# Patient Record
Sex: Female | Born: 1955 | Race: White | Hispanic: No | Smoking: Current every day smoker
Health system: Southern US, Community
[De-identification: ages and names within clinical notes are randomized; demographics above are authoritative.]

## PROBLEM LIST (undated history)

## (undated) DIAGNOSIS — G4730 Sleep apnea, unspecified: Secondary | ICD-10-CM

## (undated) DIAGNOSIS — J984 Other disorders of lung: Secondary | ICD-10-CM

## (undated) DIAGNOSIS — F32A Depression, unspecified: Secondary | ICD-10-CM

## (undated) DIAGNOSIS — E785 Hyperlipidemia, unspecified: Secondary | ICD-10-CM

## (undated) DIAGNOSIS — F329 Major depressive disorder, single episode, unspecified: Secondary | ICD-10-CM

## (undated) DIAGNOSIS — I10 Essential (primary) hypertension: Secondary | ICD-10-CM

## (undated) DIAGNOSIS — J449 Chronic obstructive pulmonary disease, unspecified: Secondary | ICD-10-CM

## (undated) DIAGNOSIS — F419 Anxiety disorder, unspecified: Secondary | ICD-10-CM

## (undated) HISTORY — DX: Chronic obstructive pulmonary disease, unspecified: J44.9

## (undated) HISTORY — PX: NASAL SINUS SURGERY: SHX719

## (undated) HISTORY — DX: Hyperlipidemia, unspecified: E78.5

## (undated) HISTORY — PX: CHOLECYSTECTOMY: SHX55

## (undated) HISTORY — PX: ABDOMINAL HYSTERECTOMY: SHX81

## (undated) HISTORY — PX: TONSILLECTOMY: SUR1361

---

## 1999-04-02 ENCOUNTER — Emergency Department (HOSPITAL_COMMUNITY): Admission: EM | Admit: 1999-04-02 | Discharge: 1999-04-02 | Payer: Self-pay | Admitting: Emergency Medicine

## 2000-06-04 ENCOUNTER — Encounter: Admission: RE | Admit: 2000-06-04 | Discharge: 2000-08-05 | Payer: Self-pay | Admitting: Family Medicine

## 2003-09-08 ENCOUNTER — Emergency Department (HOSPITAL_COMMUNITY): Admission: EM | Admit: 2003-09-08 | Discharge: 2003-09-09 | Payer: Self-pay | Admitting: Emergency Medicine

## 2003-11-04 ENCOUNTER — Other Ambulatory Visit (HOSPITAL_COMMUNITY): Admission: RE | Admit: 2003-11-04 | Discharge: 2003-11-17 | Payer: Self-pay | Admitting: Psychiatry

## 2003-11-17 ENCOUNTER — Inpatient Hospital Stay (HOSPITAL_COMMUNITY): Admission: EM | Admit: 2003-11-17 | Discharge: 2003-11-23 | Payer: Self-pay | Admitting: Psychiatry

## 2003-11-24 ENCOUNTER — Other Ambulatory Visit (HOSPITAL_COMMUNITY): Admission: RE | Admit: 2003-11-24 | Discharge: 2003-12-06 | Payer: Self-pay | Admitting: Psychiatry

## 2004-02-08 ENCOUNTER — Other Ambulatory Visit (HOSPITAL_COMMUNITY): Admission: RE | Admit: 2004-02-08 | Discharge: 2004-02-16 | Payer: Self-pay

## 2008-09-15 ENCOUNTER — Ambulatory Visit (HOSPITAL_BASED_OUTPATIENT_CLINIC_OR_DEPARTMENT_OTHER): Admission: RE | Admit: 2008-09-15 | Discharge: 2008-09-15 | Payer: Self-pay | Admitting: Family Medicine

## 2008-09-15 ENCOUNTER — Ambulatory Visit: Payer: Self-pay | Admitting: Diagnostic Radiology

## 2010-12-14 NOTE — Discharge Summary (Signed)
[name] [name], [name] [name] NO.:  [id number]   MEDICAL RECORD NO.:  [id number]                   PATIENT TYPE:  IPS   LOCATION:  0501                                 FACILITY:  [facility]   PHYSICIAN:  Jeanice Lim, M.D.              DATE OF BIRTH:  1956/06/05   DATE OF ADMISSION:  11/17/2003  DATE OF DISCHARGE:  11/23/2003                                 DISCHARGE SUMMARY   IDENTIFYING DATA:  This is a 55 year old married Caucasian female  voluntarily admitted.  Referred by outpatient psychiatrist after  progressively worsening depression since September 03, 2003, unable to meet  productivity, symptoms worsening over three weeks with isolation, laying in  bed, not able to take care of ADLs.  Has stated thoughts that husband would  be better off if she was dead and that he could get insurance.  The patient  had checked into this.  The patient sees Dr. Milford Cage and has an  appointment on November 24, 2003.  [facility] admission.  History of depression for several years.  Had been in outpatient clinic at  [facility] and prior admission to [facility].   MEDICATIONS:  Cymbalta, Wellbutrin, Zocor, Estradiol, clotrimazole,  hydrochlorothiazide, lisinopril, Norvasc, Ditropan, Zyrtec, Topamax.   ALLERGIES:  PENICILLIN, SULFA.   PHYSICAL EXAMINATION:  Physical examination and neurologic exam were  essentially within normal limits with mild head tremor.   LABORATORY DATA:  Routine admission labs essentially within normal limits.   MENTAL STATUS EXAM:  Fully alert.  Positive head tremor, which was somewhat  significant, with psychomotor slowing and affective flattening.  Speech  slowed.  Decreased productivity, monotone.  Mood depressed.  Affect blunted.  Thought processes with positive thought-blocking.  Slowed thoughts.  Difficulty concentrating.  Cognitively grossly intact.  Judgment and insight  were poor.   ADMISSION  DIAGNOSES:   AXIS I:  Major depressive disorder, recurrent, severe, profoundly depressed.   AXIS II:  Deferred.   AXIS III:  1. Hypertension.  2. __________ .  3. Dyslipidemia.   AXIS IV:  Severe (stressors related to primary support and occupation).   AXIS V:  34/60.   HOSPITAL COURSE:  The patient was admitted and ordered routine p.r.n.  medications and underwent further monitoring.  Was encouraged to participate  in individual, group and milieu therapy.  The patient was initially seen by  Dr. Dub Mikes, then Dr. Kathrynn Running and Milford Cage and Dr. Lolly Mustache.  She was  stabilized on medications gradually.  Husband was included in aftercare  planning.  The patient showed a gradual improvement in ability to cope with  stress, become less labile.  Mood became more stable and dangerous ideation  resolved with increase in hope, future, orientation and coping skills.   CONDITION ON DISCHARGE:  The patient was discharged in improved condition.  More euthymic.  Affect brighter.  No dangerous ideation  or psychotic  symptoms.  Motivated to be compliant with the aftercare plan.  Was given  medication education.   DISCHARGE MEDICATIONS:  1. Ceftin 500 mg b.i.d.  2. Risperdal 0.5 mg q.h.s.  3. Ambien 10 mg, 1/2 q.h.s.  4. Zyrtec 10 mg q.a.m.  5. Cymbalta 60 mg q.a.m.  6. Humibid L.A. 600 mg b.i.d. x 14 days.  7. Sudafed 120 mg b.i.d. x 14 days.  8. Nasonex 15 mcg, 2 sprays q.a.m.  9. Ditropan 5 mg q.a.m.  10.      Norvasc 5 mg q.a.m.  11.      Wellbutrin XL 300 mg q.a.m.  12.      Zocor 40 mg at 6 p.m.  13.      Estrace 1 mg q.a.m.  14.      Protonix 40 mg q.a.m.  15.      Trazodone 100 mg q.h.s.  16.      Topamax 100 mg q.a.m.  17.      Hydrochlorothiazide 25 mg q.a.m.  18.      Prinivil 40 mg q.a.m.   FOLLOW UP:  The patient was to follow up at mental health intensive  outpatient starting November 24, 2003 at 8:30.   DISCHARGE DIAGNOSES:   AXIS I:  Major depressive disorder,  recurrent, severe, profoundly depressed.   AXIS II:  Deferred.   AXIS III:  1. Hypertension.  2. __________ .  3. Dyslipidemia.   AXIS IV:  Severe (stressors related to primary support and occupation).   AXIS V:  Global Assessment of Functioning on discharge 55.                                              Jeanice Lim, M.D.   JEM/MEDQ  D:  01/08/2004  T:  01/08/2004  Job:  [id number]

## 2010-12-14 NOTE — Discharge Summary (Signed)
[name] [name], [name] [name] NO.:  [id number]   MEDICAL RECORD NO.:  [id number]                   PATIENT TYPE:  IPS   LOCATION:  0501                                 FACILITY:  [facility]   PHYSICIAN:  Jeanice Lim, M.D.              DATE OF BIRTH:  [birth year]   DATE OF ADMISSION:  11/17/2003  DATE OF DISCHARGE:  11/23/2003                                 DISCHARGE SUMMARY   IDENTIFYING DATA:  A 55 year old married Caucasian female, voluntarily  admitted, presenting with an increase in depressive symptoms which had  occurred for about 3 weeks and then felt her husband better off if she were  dead.   ADMISSION MEDICATIONS:  Cymbalta, Wellbutrin, Zocor, Estradiol,  hydrochlorothiazide, lisinopril, Norvasc, Ditropan XL, Zyrtec, Topamax.   PHYSICAL EXAMINATION:  Within normal limits, neurologically nonfocal.   ROUTINE ADMISSION LABS:  Within normal limits.   MENTAL STATUS EXAM:  Fully alert with a head tremor, severe psychomotor  slowing and affective flattening.  Speech slowed, decreased productivity,  depressed, positive thought blocking, slowed thoughts, alert and oriented  x3.  Cognition intact.  Judgment and insight fair.   ADMISSION DIAGNOSES:   AXIS I:  Major depressive disorder, recurrent, severe.   AXIS II:  Deferred.   AXIS III:  Hypertension, dyslipidemia.   AXIS IV:  Moderate stressors related to limited support system.   AXIS V:  30/60.   HOSPITAL COURSE:  The patient was admitted and ordered routine p.r.n.  medications, underwent further monitoring, and was encouraged to participate  in individual, group and milieu therapy.  The patient was optimized on  Cymbalta targeting depressive symptoms, reported a decrease in suicidal  ideation and began to have some hope and problem solving.  The patient was  discharged in improved condition, with no dangerous ideation or psychotic  symptoms after medication education.   DISCHARGE  MEDICATIONS:  1. Ceftin.  2. Risperdal 0.5 q.h.s.  3. Ambien 10 mg q.h.s. p.r.n.  4. Zyrtec 10 mg q.a.m.  5. Cymbalta 60 mg q.a.m.  6. __________  600 mg b.i.d.  7. Sudafed 120 mg b.i.d. for 14 days.  8. Nasonex 50 mg 1 sprays in the morning.  9. Ditropan 5 mg q.a.m.  10.      Wellbutrin XL 300 mg q.a.m.  11.      Norvasc 5 mg q.a.m.  12.      Zocor 40 mg q.6h. p.m.  13.      __________  1 mg q.a.m.  14.      Protonix 40 mg q.a.m.  15.      Trazodone 100 mg q.h.s.  16.      Topamax 100 mg q.a.m.  17.      Hydrochlorothiazide 25 mg q.a.m.  18.      Prinvil 40 mg q.a.m.   DISPOSITION:  The patient was discharged to follow up with the mental  health  center and IOP on April 28.   DISCHARGE DIAGNOSES:   AXIS I:  Major depressive disorder, recurrent, severe.   AXIS II:  Deferred.   AXIS III:  Hypertension, dyslipidemia.   AXIS IV:  Moderate stressors related to limited support system.   AXIS V:  Global assessment of function on discharge was 55.                                               Jeanice Lim, M.D.    JEM/MEDQ  D:  12/15/2003  T:  12/16/2003  Job:  [id number]

## 2011-08-07 ENCOUNTER — Inpatient Hospital Stay (HOSPITAL_COMMUNITY)
Admission: EM | Admit: 2011-08-07 | Discharge: 2011-08-13 | DRG: 193 | Disposition: A | Discharge status: Home Health | Payer: Medicare Other | Attending: Internal Medicine | Admitting: Internal Medicine

## 2011-08-07 ENCOUNTER — Other Ambulatory Visit: Payer: Self-pay

## 2011-08-07 ENCOUNTER — Emergency Department (HOSPITAL_COMMUNITY): Payer: Medicare Other

## 2011-08-07 ENCOUNTER — Encounter (HOSPITAL_COMMUNITY): Payer: Self-pay | Admitting: *Deleted

## 2011-08-07 DIAGNOSIS — J984 Other disorders of lung: Secondary | ICD-10-CM

## 2011-08-07 DIAGNOSIS — J9600 Acute respiratory failure, unspecified whether with hypoxia or hypercapnia: Secondary | ICD-10-CM | POA: Diagnosis present

## 2011-08-07 DIAGNOSIS — E876 Hypokalemia: Secondary | ICD-10-CM | POA: Diagnosis present

## 2011-08-07 DIAGNOSIS — G250 Essential tremor: Secondary | ICD-10-CM | POA: Diagnosis present

## 2011-08-07 DIAGNOSIS — F17200 Nicotine dependence, unspecified, uncomplicated: Secondary | ICD-10-CM | POA: Diagnosis present

## 2011-08-07 DIAGNOSIS — R0902 Hypoxemia: Secondary | ICD-10-CM | POA: Diagnosis present

## 2011-08-07 DIAGNOSIS — J09X1 Influenza due to identified novel influenza A virus with pneumonia: Principal | ICD-10-CM | POA: Diagnosis present

## 2011-08-07 DIAGNOSIS — K219 Gastro-esophageal reflux disease without esophagitis: Secondary | ICD-10-CM | POA: Diagnosis present

## 2011-08-07 DIAGNOSIS — E669 Obesity, unspecified: Secondary | ICD-10-CM | POA: Diagnosis present

## 2011-08-07 DIAGNOSIS — J189 Pneumonia, unspecified organism: Secondary | ICD-10-CM | POA: Diagnosis present

## 2011-08-07 DIAGNOSIS — Z6841 Body Mass Index (BMI) 40.0 and over, adult: Secondary | ICD-10-CM

## 2011-08-07 DIAGNOSIS — E785 Hyperlipidemia, unspecified: Secondary | ICD-10-CM | POA: Diagnosis present

## 2011-08-07 DIAGNOSIS — J13 Pneumonia due to Streptococcus pneumoniae: Secondary | ICD-10-CM

## 2011-08-07 DIAGNOSIS — R509 Fever, unspecified: Secondary | ICD-10-CM | POA: Diagnosis present

## 2011-08-07 DIAGNOSIS — G252 Other specified forms of tremor: Secondary | ICD-10-CM | POA: Diagnosis present

## 2011-08-07 DIAGNOSIS — J441 Chronic obstructive pulmonary disease with (acute) exacerbation: Secondary | ICD-10-CM | POA: Diagnosis present

## 2011-08-07 DIAGNOSIS — J1100 Influenza due to unidentified influenza virus with unspecified type of pneumonia: Secondary | ICD-10-CM

## 2011-08-07 DIAGNOSIS — I10 Essential (primary) hypertension: Secondary | ICD-10-CM | POA: Diagnosis present

## 2011-08-07 HISTORY — DX: Anxiety disorder, unspecified: F41.9

## 2011-08-07 HISTORY — DX: Sleep apnea, unspecified: G47.30

## 2011-08-07 HISTORY — DX: Depression, unspecified: F32.A

## 2011-08-07 HISTORY — DX: Other disorders of lung: J98.4

## 2011-08-07 HISTORY — DX: Essential (primary) hypertension: I10

## 2011-08-07 HISTORY — DX: Major depressive disorder, single episode, unspecified: F32.9

## 2011-08-07 LAB — DIFFERENTIAL
Basophils Absolute: 0 10*3/uL (ref 0.0–0.1)
Eosinophils Relative: 0 % (ref 0–5)
Lymphocytes Relative: 4 % — ABNORMAL LOW (ref 12–46)
Monocytes Absolute: 0.5 10*3/uL (ref 0.1–1.0)

## 2011-08-07 LAB — BLOOD GAS, ARTERIAL
Bicarbonate: 24.6 mEq/L — ABNORMAL HIGH (ref 20.0–24.0)
pCO2 arterial: 39.4 mmHg (ref 35.0–45.0)
pH, Arterial: 7.412 — ABNORMAL HIGH (ref 7.350–7.400)
pO2, Arterial: 63.4 mmHg — ABNORMAL LOW (ref 80.0–100.0)

## 2011-08-07 LAB — CBC
HCT: 37.5 % (ref 36.0–46.0)
MCV: 85.8 fL (ref 78.0–100.0)
RDW: 13.4 % (ref 11.5–15.5)
WBC: 11 10*3/uL — ABNORMAL HIGH (ref 4.0–10.5)

## 2011-08-07 LAB — BASIC METABOLIC PANEL
BUN: 8 mg/dL (ref 6–23)
CO2: 27 mEq/L (ref 19–32)
Calcium: 8.7 mg/dL (ref 8.4–10.5)
Creatinine, Ser: 0.73 mg/dL (ref 0.50–1.10)
Glucose, Bld: 147 mg/dL — ABNORMAL HIGH (ref 70–99)

## 2011-08-07 LAB — INFLUENZA PANEL BY PCR (TYPE A & B): Influenza A By PCR: POSITIVE — AB

## 2011-08-07 MED ORDER — SODIUM CHLORIDE 0.9 % IV SOLN
INTRAVENOUS | Status: DC
  Administered 2011-08-07 – 2011-08-08 (×2): via INTRAVENOUS

## 2011-08-07 MED ORDER — ALBUTEROL SULFATE (5 MG/ML) 0.5% IN NEBU
2.5000 mg | INHALATION_SOLUTION | RESPIRATORY_TRACT | Status: DC | PRN
  Administered 2011-08-07 – 2011-08-09 (×6): 2.5 mg via RESPIRATORY_TRACT
  Filled 2011-08-07 (×6): qty 0.5

## 2011-08-07 MED ORDER — ACETAMINOPHEN 325 MG PO TABS
650.0000 mg | ORAL_TABLET | Freq: Four times a day (QID) | ORAL | Status: DC | PRN
  Administered 2011-08-07 – 2011-08-08 (×3): 650 mg via ORAL
  Filled 2011-08-07 (×3): qty 2

## 2011-08-07 MED ORDER — ALBUTEROL SULFATE (5 MG/ML) 0.5% IN NEBU
15.0000 mg | INHALATION_SOLUTION | Freq: Once | RESPIRATORY_TRACT | Status: AC
  Administered 2011-08-07: 15 mg via RESPIRATORY_TRACT
  Filled 2011-08-07 (×2): qty 3

## 2011-08-07 MED ORDER — PREDNISONE 50 MG PO TABS
60.0000 mg | ORAL_TABLET | Freq: Every day | ORAL | Status: DC
  Administered 2011-08-08 – 2011-08-09 (×2): 60 mg via ORAL
  Filled 2011-08-07: qty 3
  Filled 2011-08-07: qty 1

## 2011-08-07 MED ORDER — IPRATROPIUM BROMIDE 0.02 % IN SOLN
0.5000 mg | RESPIRATORY_TRACT | Status: DC | PRN
  Administered 2011-08-07 – 2011-08-09 (×6): 0.5 mg via RESPIRATORY_TRACT
  Filled 2011-08-07 (×7): qty 2.5

## 2011-08-07 MED ORDER — OSELTAMIVIR PHOSPHATE 75 MG PO CAPS
75.0000 mg | ORAL_CAPSULE | Freq: Two times a day (BID) | ORAL | Status: DC
  Administered 2011-08-08 – 2011-08-12 (×10): 75 mg via ORAL
  Filled 2011-08-07 (×14): qty 1

## 2011-08-07 MED ORDER — IPRATROPIUM BROMIDE 0.02 % IN SOLN
0.5000 mg | Freq: Once | RESPIRATORY_TRACT | Status: AC
  Administered 2011-08-07: 0.5 mg via RESPIRATORY_TRACT
  Filled 2011-08-07: qty 2.5

## 2011-08-07 MED ORDER — NICOTINE 14 MG/24HR TD PT24
14.0000 mg | MEDICATED_PATCH | Freq: Every day | TRANSDERMAL | Status: DC
  Administered 2011-08-09 – 2011-08-13 (×5): 14 mg via TRANSDERMAL
  Filled 2011-08-07 (×7): qty 1

## 2011-08-07 MED ORDER — ALBUTEROL SULFATE (5 MG/ML) 0.5% IN NEBU
5.0000 mg | INHALATION_SOLUTION | Freq: Once | RESPIRATORY_TRACT | Status: AC
  Administered 2011-08-07: 5 mg via RESPIRATORY_TRACT
  Filled 2011-08-07: qty 0.5

## 2011-08-07 MED ORDER — PREDNISONE 20 MG PO TABS
60.0000 mg | ORAL_TABLET | Freq: Once | ORAL | Status: AC
  Administered 2011-08-07: 60 mg via ORAL
  Filled 2011-08-07: qty 3

## 2011-08-07 MED ORDER — ENOXAPARIN SODIUM 40 MG/0.4ML ~~LOC~~ SOLN
40.0000 mg | SUBCUTANEOUS | Status: DC
  Administered 2011-08-08 – 2011-08-13 (×6): 40 mg via SUBCUTANEOUS
  Filled 2011-08-07 (×6): qty 0.4

## 2011-08-07 MED ORDER — ACETAMINOPHEN 325 MG PO TABS
650.0000 mg | ORAL_TABLET | Freq: Once | ORAL | Status: AC
  Administered 2011-08-07: 650 mg via ORAL
  Filled 2011-08-07: qty 2

## 2011-08-07 MED ORDER — MOXIFLOXACIN HCL IN NACL 400 MG/250ML IV SOLN
400.0000 mg | INTRAVENOUS | Status: DC
  Administered 2011-08-07 – 2011-08-11 (×5): 400 mg via INTRAVENOUS
  Filled 2011-08-07 (×7): qty 250

## 2011-08-07 MED ORDER — POTASSIUM CHLORIDE CRYS ER 20 MEQ PO TBCR
40.0000 meq | EXTENDED_RELEASE_TABLET | Freq: Every day | ORAL | Status: DC
  Administered 2011-08-08 – 2011-08-09 (×2): 40 meq via ORAL
  Filled 2011-08-07 (×3): qty 2

## 2011-08-07 MED ORDER — SODIUM CHLORIDE 0.9 % IV SOLN
INTRAVENOUS | Status: DC
Start: 2011-08-07 — End: 2011-08-07
  Administered 2011-08-07: 11:00:00 via INTRAVENOUS

## 2011-08-07 MED ORDER — HYDROCOD POLST-CHLORPHEN POLST 10-8 MG/5ML PO LQCR
5.0000 mL | Freq: Once | ORAL | Status: AC
  Administered 2011-08-07: 5 mL via ORAL
  Filled 2011-08-07: qty 5

## 2011-08-07 NOTE — Consult Note (Signed)
Name: [name] [name] MRN: [id number] DOB: 03-Dec-1955    LOS: 0  PCCM CONSULTATION  NOTE  History of Present Illness: This is a 56 year old white female with previous pre-existing history of restrictive lung disease and airway obstruction due to ongoing smoking.  Patient notes a 24-hour history of increased fever, body aches., And cough. The patient also notes increased sore throat. She did receive her flu vaccine but her knowledge she is uncertain when this occurred. She has ongoing smoking use. The patient was brought to the emergency room and found to have a left lower lobe infiltrate and admitted to the hospitalist service. Critical care pulmonary was consult and.  There is no prior known history of admissions for COPD and no previous workup for chronic airways disease.  Lines / Drains: None  Cultures: BCx2 1/9 Flu PCR 1/9  Antibiotics: avelox (cap)1/9 tamiflu (ILI) 1/9   Tests / Events:     Past Medical History  Diagnosis Date  . Restrictive lung disease   . Sleep apnea   . Hypertension   . Depression   . Anxiety    Past Surgical History  Procedure Date  . Abdominal hysterectomy   . Cholecystectomy   . Nasal sinus surgery   . Tonsillectomy    Prior to Admission medications   Medication Sig Start Date End Date Taking? Authorizing Provider  albuterol (PROVENTIL HFA;VENTOLIN HFA) 108 (90 BASE) MCG/ACT inhaler Inhale 2 puffs into the lungs every 6 (six) hours as needed. wheezing   Yes Historical Provider, MD  amLODipine (NORVASC) 5 MG tablet Take 5 mg by mouth daily.   Yes Historical Provider, MD  cetirizine (ZYRTEC) 10 MG tablet Take 10 mg by mouth daily.   Yes Historical Provider, MD  DULoxetine (CYMBALTA) 30 MG capsule Take 30 mg by mouth daily.   Yes Historical Provider, MD  methocarbamol (ROBAXIN) 500 MG tablet Take 500 mg by mouth 4 (four) times daily.   Yes Historical Provider, MD  pravastatin (PRAVACHOL) 20 MG tablet Take 20 mg by mouth daily.   Yes Historical  Provider, MD  traZODone (DESYREL) 100 MG tablet Take 100-200 mg by mouth at bedtime.   Yes Historical Provider, MD  valsartan-hydrochlorothiazide (DIOVAN-HCT) 320-25 MG per tablet Take 1 tablet by mouth daily.   Yes Historical Provider, MD   Allergies Allergies  Allergen Reactions  . Sulfa Antibiotics Other (See Comments)    Dizzines, nausea  . Penicillins Rash    Family History No family history on file.  Social History  reports that she has been smoking.  She does not have any smokeless tobacco history on file. She reports that she does not drink alcohol or use illicit drugs.  Review Of Systems  11 points review of systems is negative with an exception of listed in HPI.  Vital Signs: Temp:  [98.6 F (37 C)-101.7 F (38.7 C)] 98.6 F (37 C) (01/09 1528) Pulse Rate:  [83-98] 88  (01/09 1528) Resp:  [22-26] 22  (01/09 1528) BP: (141-160)/(53-81) 141/53 mmHg (01/09 1410) SpO2:  [93 %-96 %] 94 % (01/09 1528) Weight:  [120.203 kg (265 lb)] 120.203 kg (265 lb) (01/09 1006)    Physical Examination: General:  Obese white female in no acute distress Neuro:  Neurologically intact moves all fours awake and alert   HEENT:  Dry mucous membranes no jugular venous distention no lymphadenopathy Neck:  Neck supple   Cardiovascular:  Regular rate and rhythm normal S1-S2 no S3 or S4 no murmur rub or gallop Lungs:  distant breath sounds, expired wheezes, and solid changes left lower lobe Abdomen:  Soft nontender bowel sounds active no masses  Musculoskeletal:  Full range of motion no joint deformities Skin:  Clear     Labs and Imaging:  Reviewed.  Please refer to the Assessment and Plan section for relevant results. Dg Chest 2 View  08/07/2011  *RADIOLOGY REPORT*  Clinical Data: Cough, severe shortness of breath, fever, congestion, smoker, asthma  CHEST - 2 VIEW  Comparison: 09/15/2008  Findings: Normal heart size, mediastinal contours, and pulmonary vascularity. Minimal chronic  subsegmental atelectasis versus scarring at lingula. No definite acute infiltrate, pleural effusion or pneumothorax. No acute osseous findings.  IMPRESSION: No acute abnormalities.  Original Report Authenticated By: Lollie Marrow, M.D.   NOTE DR Allissa Albright FEELS THERE IS AN INFILTRATE IN THE LLL.   Assessment and Plan: Principal Problem:  *Pneumonia, community acquired Patient appears to have a left lower lobe infiltrate in with fever and elevated white count with associated hypoxemia I feel this patient likely has a community-acquired pneumonia Patient also has an influenza like illness associated  Plan Agree with Tamiflu Administer IV Avelox Intensive neb treatments Oxygen titration   Active Problems:  Hypoxemia Hypoxemia appears to be due to obstructive airways disease and left lower lobe infiltrate Plan Oxygen titration     COPD with acute exacerbation Airways appear to be obstructed Plan  bronchodilator therapy   Tobacco use disorder Cessation counseling needs to be provided  Admission to regular medical surgical bed is indicated    Shan Levans  M.D. Pulmonary and Critical Care Medicine [facility] Cell: [phone]   Pager: [phone] 08/07/2011, 5:27 PM

## 2011-08-07 NOTE — ED Provider Notes (Signed)
History     CSN: [id number]  Arrival date & time 08/07/11  9562   First MD Initiated Contact with Patient 08/07/11 1021      Chief Complaint  Patient presents with  . Respiratory Distress    (Consider location/radiation/quality/duration/timing/severity/associated sxs/prior treatment) The history is provided by the patient. The history is limited by the condition of the patient.   the patient is a 56 year old, female, with restrictive lung disease, who presents to emergency department complaining of right-sided chest pain, a nonproductive cough, and shortness of breath since yesterday.  She denies smoking or oxygen use at home.  She denies nausea, vomiting, fevers, chills, leg pain or swelling.  She denies congestive heart failure.  Past Medical History  Diagnosis Date  . Restrictive lung disease   . Sleep apnea   . Hypertension   . Depression   . Anxiety     Past Surgical History  Procedure Date  . Abdominal hysterectomy   . Cholecystectomy   . Nasal sinus surgery   . Tonsillectomy     No family history on file.  History  Substance Use Topics  . Smoking status: Current Everyday Smoker -- 1.5 packs/day  . Smokeless tobacco: Not on file  . Alcohol Use: No    OB History    Grav Para Term Preterm Abortions TAB SAB Ect Mult Living                  Review of Systems  Constitutional: Negative for fever and chills.  HENT: Negative for congestion.   Respiratory: Positive for cough and shortness of breath.   Cardiovascular: Positive for chest pain. Negative for leg swelling.       Right-sided chest pain  Gastrointestinal: Negative for nausea and vomiting.  Neurological: Positive for headaches.  Psychiatric/Behavioral: Negative for confusion.  All other systems reviewed and are negative.    Allergies  Sulfa antibiotics and Penicillins  Home Medications   Current Outpatient Rx  Name Route Sig Dispense Refill  . CETIRIZINE HCL 10 MG PO TABS Oral Take 10 mg by  mouth daily.      BP 160/81  Pulse 98  Temp(Src) 99.7 F (37.6 C) (Oral)  Resp 22  Wt 265 lb (120.203 kg)  SpO2 96%  Physical Exam  Constitutional: She is oriented to person, place, and time. She appears distressed.       Morbidly obese.  Mild respiratory distress and discomfort  HENT:  Head: Normocephalic and atraumatic.  Eyes: Pupils are equal, round, and reactive to light.  Neck: Normal range of motion.  Cardiovascular: Normal rate, regular rhythm and normal heart sounds.   No murmur heard. Pulmonary/Chest: Breath sounds normal. She is in respiratory distress. She has no wheezes. She has no rales.  Abdominal: Soft. She exhibits no distension and no mass. There is no tenderness. There is no rebound and no guarding.  Musculoskeletal: Normal range of motion. She exhibits no edema and no tenderness.  Neurological: She is alert and oriented to person, place, and time. No cranial nerve deficit.  Skin: Skin is warm and dry. No rash noted. She is not diaphoretic. No erythema.  Psychiatric: She has a normal mood and affect. Her behavior is normal.    ED Course  Procedures (including critical care time) 56 year old, female, with restrictive lung disease, presents with a nonproductive cough, and progressive shortness of breath, and mild respiratory distress.  Since yesterday.  We will perform a chest x-ray, and laboratory tests, and treat her with  bronchodilators, and steroids, and an reevaluate her for making her final disposition   Labs Reviewed  CBC  DIFFERENTIAL  BASIC METABOLIC PANEL   No results found.   No diagnosis found.  1:07 PM Feels a little better. Still  In  Mild resp distress. Now has faint wheezes after albuterol.  She is moving more air. Will give 1 hr neb.   After 1 hr neb, moves more air but still anxious and in resp distress.  CRITICAL CARE Performed by: Nicholes Stairs   Total critical care time: 30 min  Critical care time was exclusive of  separately billable procedures and treating other patients.  Critical care was necessary to treat or prevent imminent or life-threatening deterioration.  Critical care was time spent personally by me on the following activities: development of treatment plan with patient and/or surrogate as well as nursing, discussions with consultants, evaluation of patient's response to treatment, examination of patient, obtaining history from patient or surrogate, ordering and performing treatments and interventions, ordering and review of laboratory studies, ordering and review of radiographic studies, pulse oximetry and re-evaluation of patient's condition.  MDM  resp distress        Nicholes Stairs, MD 08/07/11 [phone]

## 2011-08-07 NOTE — ED Notes (Signed)
Pt states "I have restrictive lung disease, I've been coughing since Sunday"

## 2011-08-07 NOTE — ED Notes (Signed)
tamiflu requested from pharmacy.

## 2011-08-07 NOTE — H&P (Signed)
Hospital Admission Note Date: 08/07/2011  Patient name: [name] [name] Medical record number: [id number] Date of birth: 15-Feb-1956 Age: 56 y.o. Gender: female PCP: Sid Falcon, MD  Attending physician: Kathlen Mody  Chief Complaint:Difficulty breathing, Fever and chills for 1 day.  History of Present Illness:Pt states that she was in her usual state of health until approximately 24 hrs ago when she had a sudden onset of Fever- Tmax 101, and Chills. Pt states that approximately 2 hrs later she began to have SOB which became progressively worse. She has had a non productive cough. She denies any N/V/D., or chest pain.  She states that she is not dependent on Oxygen at home and requires only occasional use of her albuterol.   Upon arrival to the ED she was reported to be in acute respiratory distress, and found to be hypoxic with very little movement of air in the chest. She was treated with an initial Albuterol and Atrovent nebulized treatment and then received an hour long nebulized Albuterol treatment. On my examination of [name] [name] she is conversant without dyspnea or accessory muscle uses and states that she feels much better. She presently has an Oxygen requirement of 3L/min via nasal cannula. Her nurse reports that since her hour long albuterol treatment, she has been breathing comfortably.The patient is presently requesting food as she feels that her appetite has improved. Scheduled Meds:   . acetaminophen  650 mg Oral Once  . albuterol  15 mg Nebulization Once  . albuterol  5 mg Nebulization Once  . chlorpheniramine-HYDROcodone  5 mL Oral Once  . ipratropium  0.5 mg Nebulization Once  . oseltamivir  75 mg Oral BID  . predniSONE  60 mg Oral Once   Continuous Infusions:   . DISCONTD: sodium chloride 125 mL/hr at 08/07/11 1100   PRN Meds:.albuterol, ipratropium Allergies: Sulfa antibiotics and Penicillins Past Medical History  Diagnosis Date  . Restrictive lung disease   . Sleep  apnea   . Hypertension   . Depression   . Anxiety    Past Surgical History  Procedure Date  . Abdominal hysterectomy   . Cholecystectomy   . Nasal sinus surgery   . Tonsillectomy    No family history on file. History   Social History  . Marital Status: Married    Spouse Name: N/A    Number of Children: N/A  . Years of Education: N/A   Occupational History  . Not on file.   Social History Main Topics  . Smoking status: Current Everyday Smoker -- 1.5 packs/day  . Smokeless tobacco: Not on file  . Alcohol Use: No  . Drug Use: No  . Sexually Active:    Other Topics Concern  . Not on file   Social History Narrative  . No narrative on file   Review of Systems: A comprehensive review of systems was negative, except as noted in HPI. Physical Exam: No intake or output data in the 24 hours ending 08/07/11 1611 General: Alert, awake, oriented x3, in no acute distress. Mild increase in work of breathing. HEENT: [place]/AT PEERL, EOMI Neck: Trachea midline,  no masses, no thyromegal,y no JVD, no carotid bruit OROPHARYNX:  Moist, No exudate/ erythema/lesions.  Heart: Regular rate and rhythm, without murmurs, rubs, gallops, PMI non-displaced, no heaves or thrills on palpation.  Lungs: Clear to auscultation,mild diffuse wheezing, but no rhonchi noted. No increased vocal fremitus resonant to percussion  Abdomen: Soft, nontender, nondistended, positive bowel sounds, no masses no hepatosplenomegaly noted..  Neuro:  No focal neurological deficits noted cranial nerves II through XII grossly intact. DTRs 2+ bilaterally upper and lower extremities. Strength functional in bilateral upper and lower extremities. Musculoskeletal: No warm swelling or erythema around joints, no spinal tenderness noted. Psychiatric: Patient alert and oriented x3, good insight and cognition, good recent to remote recall. Lymph node survey: No cervical axillary or inguinal lymphadenopathy noted.  Lab  results:  Basename 08/07/11 1028  NA 134*  K 3.4*  CL 98  CO2 27  GLUCOSE 147*  BUN 8  CREATININE 0.73  CALCIUM 8.7  MG --  PHOS --   No results found for this basename: AST:2,ALT:2,ALKPHOS:2,BILITOT:2,PROT:2,ALBUMIN:2 in the last 72 hours No results found for this basename: LIPASE:2,AMYLASE:2 in the last 72 hours  Basename 08/07/11 1028  WBC 11.0*  NEUTROABS 10.1*  HGB 12.6  HCT 37.5  MCV 85.8  PLT 172   No results found for this basename: CKTOTAL:3,CKMB:3,CKMBINDEX:3,TROPONINI:3 in the last 72 hours No components found with this basename: POCBNP:3 No results found for this basename: DDIMER:2 in the last 72 hours No results found for this basename: HGBA1C:2 in the last 72 hours No results found for this basename: CHOL:2,HDL:2,LDLCALC:2,TRIG:2,CHOLHDL:2,LDLDIRECT:2 in the last 72 hours No results found for this basename: TSH,T4TOTAL,FREET3,T3FREE,THYROIDAB in the last 72 hours No results found for this basename: VITAMINB12:2,FOLATE:2,FERRITIN:2,TIBC:2,IRON:2,RETICCTPCT:2 in the last 72 hours Imaging results:  Dg Chest 2 View  08/07/2011  *RADIOLOGY REPORT*  Clinical Data: Cough, severe shortness of breath, fever, congestion, smoker, asthma  CHEST - 2 VIEW  Comparison: 09/15/2008  Findings: Normal heart size, mediastinal contours, and pulmonary vascularity. Minimal chronic subsegmental atelectasis versus scarring at lingula. No definite acute infiltrate, pleural effusion or pneumothorax. No acute osseous findings.  IMPRESSION: No acute abnormalities.  Original Report Authenticated By: Lollie Marrow, M.D.   Other results: EKG: normal EKG, normal sinus rhythm.   Patient Active Hospital Problem List: Pt presenting with sudden onset fever and chills with a high suspicion for influenza. Given the onset, I will start Tamiflu presumptively and discontinue if PCR is negative for influenza. Will give supportive care with IVF.  Acute exacerbation of COPD with hypoxemia: treat with  albuterol and Atrovent and oral prednisone. Will hold on antibiotics for now unless pt's respiratory status deteriorates as I suspect that her exacerbation of COPD is due to influenza.   Tobacco use disorder: Nicoderm patch.  DVT prophylaxis: Lovenox  MATTHEWS,MICHELLE A. 08/07/2011, 4:11 PM

## 2011-08-07 NOTE — ED Notes (Addendum)
Pt c/o sob (+) labored resp, lung sounds noted with rales (+) expiratory wheezing on and off, to initiate neb treatments

## 2011-08-07 NOTE — ED Notes (Signed)
[id number]<QI> Expected date:08/07/11<BR> Expected time: 9:33 AM<BR> Means of arrival:Ambulance<BR> Comments:<BR> hypotensive

## 2011-08-07 NOTE — ED Notes (Signed)
hospitalist in to see pt.

## 2011-08-08 ENCOUNTER — Emergency Department (HOSPITAL_COMMUNITY): Payer: Medicare Other

## 2011-08-08 ENCOUNTER — Encounter (HOSPITAL_COMMUNITY): Payer: Self-pay

## 2011-08-08 DIAGNOSIS — J1100 Influenza due to unidentified influenza virus with unspecified type of pneumonia: Secondary | ICD-10-CM

## 2011-08-08 DIAGNOSIS — J13 Pneumonia due to Streptococcus pneumoniae: Secondary | ICD-10-CM

## 2011-08-08 LAB — BASIC METABOLIC PANEL
BUN: 11 mg/dL (ref 6–23)
CO2: 26 mEq/L (ref 19–32)
Calcium: 8.6 mg/dL (ref 8.4–10.5)
Creatinine, Ser: 0.71 mg/dL (ref 0.50–1.10)
GFR calc Af Amer: >90 mL/min (ref >90)

## 2011-08-08 MED ORDER — OXYCODONE HCL 5 MG PO TABS
5.0000 mg | ORAL_TABLET | ORAL | Status: DC | PRN
  Administered 2011-08-08 (×2): 5 mg via ORAL
  Filled 2011-08-08 (×2): qty 1

## 2011-08-08 NOTE — Progress Notes (Signed)
Subjective: Pt breathing better than yesterday. Complaining of back pain   Objective: Weight change:  No intake or output data in the 24 hours ending 08/08/11 1644  Filed Vitals:   08/08/11 1637  BP: 120/73  Pulse: 74  Temp: 99.1 F (37.3 C)  Resp: 28  pt alert , low grade fever, comfortable cvs s1 s2 heard  Lungs bilateral scattered rhonchi heard Abdomen benign, Extremities: trace edema.  Lab Results: Results for orders placed during the hospital encounter of 08/07/11 (from the past 24 hour(s))  BASIC METABOLIC PANEL     Status: Abnormal   Collection Time   08/08/11  5:00 AM      Component Value Range   Sodium 134 (*) 135 - 145 (mEq/L)   Potassium 3.4 (*) 3.5 - 5.1 (mEq/L)   Chloride 99  96 - 112 (mEq/L)   CO2 26  19 - 32 (mEq/L)   Glucose, Bld 128 (*) 70 - 99 (mg/dL)   BUN 11  6 - 23 (mg/dL)   Creatinine, Ser 1.61  0.50 - 1.10 (mg/dL)   Calcium 8.6  8.4 - 09.6 (mg/dL)   GFR calc non Af Amer >90  >90 (mL/min)   GFR calc Af Amer >90  >90 (mL/min)     Micro Results: No results found for this or any previous visit (from the past 240 hour(s)).  Studies/Results: Dg Chest 2 View  08/07/2011  *RADIOLOGY REPORT*  Clinical Data: Cough, severe shortness of breath, fever, congestion, smoker, asthma  CHEST - 2 VIEW  Comparison: 09/15/2008  Findings: Normal heart size, mediastinal contours, and pulmonary vascularity. Minimal chronic subsegmental atelectasis versus scarring at lingula. No definite acute infiltrate, pleural effusion or pneumothorax. No acute osseous findings.  IMPRESSION: No acute abnormalities.  Original Report Authenticated By: Lollie Marrow, M.D.   Dg Chest Port 1 View  08/08/2011  *RADIOLOGY REPORT*  Clinical Data: Pneumonia.  Short of breath.  Cough and congestion.  PORTABLE CHEST - 1 VIEW  Comparison: 08/07/2011.  Findings: Low volume chest.  Basilar atelectasis.  No airspace disease.  No effusion.  Cardiopericardial silhouette appears within normal limits.  Monitoring leads are projected over the chest.  IMPRESSION: Low volume chest without acute cardiopulmonary disease.  Original Report Authenticated By: Andreas Newport, M.D.   Medications: Scheduled Meds:   . enoxaparin  40 mg Subcutaneous Q24H  . moxifloxacin  400 mg Intravenous Q24H  . nicotine  14 mg Transdermal Daily  . oseltamivir  75 mg Oral BID  . potassium chloride  40 mEq Oral Daily  . predniSONE  60 mg Oral Q breakfast   Continuous Infusions:   . sodium chloride 50 mL/hr at 08/07/11 2154   PRN Meds:.acetaminophen, albuterol, ipratropium, oxyCODONE  Assessment/Plan: Patient Active Hospital Problem List:  Acute on chronic respiratory failure secondary to CAP AND copd exacerbation with influenza.  On iv antibiotics, and tamiflu. Continue with bronchodilator therapy. Repeat cxr in 24 hours for resolution of pna.   Hypokalemia: is being repleted.  dvt prophylaxis; on lovenox.  LOS: 1 day   Abdallah Hern 08/08/2011, 4:44 PM

## 2011-08-08 NOTE — ED Notes (Signed)
(+)   intermittent non-productive cough, denies pain, pt states" i wanna sleep for now", maintained on 02@ 2l/[place]

## 2011-08-08 NOTE — Consult Note (Signed)
   LOS: 1  PCCM PROGRESS  NOTE  Brief profile:   56yowf active smoker with previous pre-existing history of restrictive lung disease and airway obstruction due to ongoing smoking c/o 24-hour history of increased fever, body aches., And cough. The patient also notes increased sore throat. She did receive her flu vaccine but her knowledge she is uncertain when this occurred. [facility]  [facility]9 and found to have a left lower lobe infiltrate and admitted to the hospitalist service. Critical care pulmonary was consulted 1/9 pm   .  Lines / Drains: None  Cultures: BCx2 1/9>>>    Antibiotics: avelox (cap)1/9>>> tamiflu (cap 1/9>>>   Tests / Events:  Vital Signs: Temp:  [98.6 F (37 C)-102.9 F (39.4 C)] 99.3 F (37.4 C) (01/10 1131) Pulse Rate:  [81-115] 81  (01/10 1131) Resp:  [21-26] 24  (01/10 1131) BP: (127-178)/(47-73) 127/60 mmHg (01/10 1131) SpO2:  [94 %-98 %] 94 % (01/10 1131)    Physical Examination: General:  Obese white female in mild  distress Neuro:  Neurologically intact moves all fours awake and alert   HEENT:  Dry mucous membranes no jugular venous distention no lymphadenopathy Neck:  Neck supple   Cardiovascular:  Regular rate and rhythm normal S1-S2 no S3 or S4 no murmur rub or gallop Lungs:  distant breath sounds, expired wheezes, and solid changes left lower lobe Abdomen:  Soft nontender bowel sounds active no masses  Musculoskeletal:  Full range of motion no joint deformities Skin:  Clear     Labs  Lab 08/08/11 0500 08/07/11 1028  NA 134* 134*  K 3.4* 3.4*  CL 99 98  CO2 26 27  BUN 11 8  CREATININE 0.71 0.73  GLUCOSE 128* 147*    Lab 08/07/11 1028  HGB 12.6  HCT 37.5  WBC 11.0*  PLT 172         Assessment and Plan: Principal Problem:  *Pneumonia, community acquired Patient appears to have possible  left lower lobe infiltrate in with fever and elevated white count with associated hypoxemia I feel this patient likely has a  community-acquired pneumonia Patient also has an influenza like illness associated  Plan Agree with Tamiflu And  IV Avelox Intensive neb treatments Oxygen titration     Acute respiratory failure Hypoxemia appears to be due to obstructive airways disease and left lower lobe infiltrate Plan Oxygen titration    ? COPD with acute exacerbation  - no baseline pfts   Plan  bronchodilator therapy   Tobacco use disorder Cessation counseling needs to be provided      Brett Canales Minor ACNP [facility] PCCM Pager [phone] till 3 pm If no answer page [phone] 08/08/2011, 12:03 PM  Sandrea Hughs, MD Pulmonary and Critical Care Medicine [facility] Cell [phone]

## 2011-08-08 NOTE — ED Notes (Addendum)
Temp 100.9/ [place], unabled to give tylenol @ this time, tylenol ordered q 6 hrs prn last dose was 2225, closely monitored

## 2011-08-08 NOTE — ED Notes (Signed)
Report called to 3 [place] RN

## 2011-08-08 NOTE — ED Notes (Signed)
(+)   non productive cough (+) dyspnea on exertion (+) rhonchi bilaterally (-) wheezing, albuterol/atrovent nebs in progress

## 2011-08-09 ENCOUNTER — Inpatient Hospital Stay (HOSPITAL_COMMUNITY): Payer: Medicare Other

## 2011-08-09 LAB — MRSA PCR SCREENING: MRSA by PCR: NEGATIVE

## 2011-08-09 LAB — CBC
HCT: 33.2 % — ABNORMAL LOW (ref 36.0–46.0)
MCHC: 32.8 g/dL (ref 30.0–36.0)
MCV: 87.6 fL (ref 78.0–100.0)
Platelets: 134 10*3/uL — ABNORMAL LOW (ref 150–400)
RDW: 14.1 % (ref 11.5–15.5)
WBC: 6 10*3/uL (ref 4.0–10.5)

## 2011-08-09 LAB — COMPREHENSIVE METABOLIC PANEL
AST: 23 U/L (ref 0–37)
Albumin: 2.8 g/dL — ABNORMAL LOW (ref 3.5–5.2)
BUN: 15 mg/dL (ref 6–23)
Chloride: 105 mEq/L (ref 96–112)
Creatinine, Ser: 0.8 mg/dL (ref 0.50–1.10)
Total Bilirubin: 0.2 mg/dL — ABNORMAL LOW (ref 0.3–1.2)
Total Protein: 5.9 g/dL — ABNORMAL LOW (ref 6.0–8.3)

## 2011-08-09 MED ORDER — PANTOPRAZOLE SODIUM 40 MG PO TBEC
40.0000 mg | DELAYED_RELEASE_TABLET | Freq: Two times a day (BID) | ORAL | Status: DC
  Administered 2011-08-09 – 2011-08-13 (×8): 40 mg via ORAL
  Filled 2011-08-09 (×9): qty 1

## 2011-08-09 MED ORDER — OXYCODONE HCL 5 MG PO TABS
5.0000 mg | ORAL_TABLET | ORAL | Status: DC | PRN
  Administered 2011-08-09 – 2011-08-13 (×13): 5 mg via ORAL
  Filled 2011-08-09 (×13): qty 1

## 2011-08-09 MED ORDER — POTASSIUM CHLORIDE 10 MEQ/100ML IV SOLN
10.0000 meq | INTRAVENOUS | Status: AC
  Administered 2011-08-09 (×3): 10 meq via INTRAVENOUS
  Filled 2011-08-09 (×2): qty 100

## 2011-08-09 MED ORDER — METHYLPREDNISOLONE SODIUM SUCC 125 MG IJ SOLR
INTRAMUSCULAR | Status: AC
  Filled 2011-08-09: qty 2

## 2011-08-09 MED ORDER — MORPHINE SULFATE 2 MG/ML IJ SOLN
INTRAMUSCULAR | Status: AC
  Administered 2011-08-09: 2 mg via INTRAVENOUS
  Filled 2011-08-09: qty 1

## 2011-08-09 MED ORDER — MORPHINE SULFATE 2 MG/ML IJ SOLN
2.0000 mg | INTRAMUSCULAR | Status: DC | PRN
  Administered 2011-08-09: 2 mg via INTRAVENOUS

## 2011-08-09 MED ORDER — ALBUTEROL SULFATE (5 MG/ML) 0.5% IN NEBU
2.5000 mg | INHALATION_SOLUTION | RESPIRATORY_TRACT | Status: DC
  Administered 2011-08-09 – 2011-08-13 (×25): 2.5 mg via RESPIRATORY_TRACT
  Filled 2011-08-09 (×28): qty 0.5

## 2011-08-09 MED ORDER — IPRATROPIUM BROMIDE 0.02 % IN SOLN
0.5000 mg | RESPIRATORY_TRACT | Status: DC
  Administered 2011-08-09 – 2011-08-13 (×25): 0.5 mg via RESPIRATORY_TRACT
  Filled 2011-08-09 (×27): qty 2.5

## 2011-08-09 MED ORDER — POTASSIUM CHLORIDE 10 MEQ/100ML IV SOLN
INTRAVENOUS | Status: AC
  Filled 2011-08-09: qty 100

## 2011-08-09 MED ORDER — METHYLPREDNISOLONE SODIUM SUCC 125 MG IJ SOLR
60.0000 mg | Freq: Two times a day (BID) | INTRAMUSCULAR | Status: DC
  Administered 2011-08-09: 60 mg via INTRAVENOUS

## 2011-08-09 MED ORDER — METHYLPREDNISOLONE SODIUM SUCC 125 MG IJ SOLR
80.0000 mg | Freq: Two times a day (BID) | INTRAMUSCULAR | Status: DC
  Administered 2011-08-09 – 2011-08-11 (×4): 80 mg via INTRAVENOUS
  Filled 2011-08-09 (×5): qty 1.28

## 2011-08-09 NOTE — Progress Notes (Signed)
Pt. Had labored breathing which worsened w/ exertion/speech/eating. Pt. Was changed from 3L O2 via [place] to Venti mask at 10L. Pt. Reported some improvement in resp. Status. Pt. Was given Oxy IR for chest pain after coughing. Chest pain was relieved, but showed no improvement w/ SOB. Pt. Was given two neb tx via resp. Therapist which improved upper lobe auscultation, but didn't improve labored breathing. Floor coverage was contacted, morphine order was given as well as transfer order to step-down. Pt. Was informed and in agreement w/ transfer. Report was called to [employer] at [employer] and Pt. Was transported w/ venti-mask and resp. Mask in place to ICU at 0340. Pt. Tolerated transfer well.

## 2011-08-09 NOTE — Progress Notes (Signed)
   LOS: 2  PCCM PROGRESS  NOTE  Brief profile:   56yowf active smoker with previous pre-existing history of obesity aand ?  airway obstruction due to ongoing smoking c/o 24-hour history of increased fever, body aches, cough,  sore throat. She did receive her flu vaccine but her knowledge she is uncertain when this occurred. [facility]  [facility]9 and found to have a left lower lobe infiltrate and admitted to the hospitalist service. Critical care pulmonary was consulted 1/9 pm   .  Lines / Drains: None  Cultures:      Antibiotics: avelox (cap)1/9>>> tamiflu (cap 1/9>>>   Tests / Events: Nocturnal bipap x sev hours only  Vital Signs: Temp:  [98.8 F (37.1 C)-99.3 F (37.4 C)] 99.2 F (37.3 C) (01/11 0800) Pulse Rate:  [65-81] 65  (01/11 0800) Resp:  [20-28] 26  (01/11 0800) BP: (114-140)/(60-79) 140/67 mmHg (01/11 0800) SpO2:  [94 %-100 %] 99 % (01/11 0929) FiO2 (%):  [35 %] 35 % (01/11 0929) Weight:  [270 lb 4.5 oz (122.6 kg)-272 lb 7.8 oz (123.6 kg)] 272 lb 7.8 oz (123.6 kg) (01/11 0400) I/O last 3 completed shifts: In: 680 [P.O.:480; I.V.:200] Out: 400 [Urine:400]     . DISCONTD: sodium chloride 50 mL/hr at 08/09/11 0400    Physical Examination: General:  Obese white female in mild  distress Neuro:  Neurologically intact moves all fours awake and alert   HEENT:  Dry mucous membranes no jugular venous distention no lymphadenopathy Neck:  Neck supple   Cardiovascular:  Regular rate and rhythm normal S1-S2 no S3 or S4 no murmur rub or gallop Lungs:  distant breath sounds, expired wheezes, and solid changes left lower lobe. Off NIMVS Abdomen:  Soft nontender bowel sounds active no masses  Musculoskeletal:  Full range of motion no joint deformities Skin:  Clear  Vent Mode:  [-]  FiO2 (%):  [35 %] 35 %  Labs  Lab 08/09/11 0440 08/08/11 0500 08/07/11 1028  NA 137 134* 134*  K 3.2* 3.4* 3.4*  CL 105 99 98  CO2 26 26 27   BUN 15 11 8   CREATININE 0.80 0.71 0.73    GLUCOSE 130* 128* 147*    Lab 08/09/11 0440 08/07/11 1028  HGB 10.9* 12.6  HCT 33.2* 37.5  WBC 6.0 11.0*  PLT 134* 172         Assessment and Plan: Principal Problem:  *Pneumonia, community acquired Patient appears to have possible  left lower lobe infiltrate  with fever and elevated white count with associated hypoxemia c/w CAP Patient also has an influenza like illness associated  Plan Agree with Tamiflu And  IV Avelox Intensive neb treatments Oxygen titration PRN NIMVS     Acute respiratory failure Hypoxemia appears to be due to obstructive airways disease and left lower lobe infiltrate Plan Oxygen titration PRN NIMVS   ? COPD with acute exacerbation  - no baseline pfts   Plan  bronchodilator therapy   Tobacco use disorder Cessation counseling needs to be provided   Hypokalemia: -replete as needed    Brett Canales Minor ACNP [facility] PCCM Pager [phone] till 3 pm If no answer page [phone] 08/09/2011, 10:12 AM  Pt independently  seen and examined and available cxr's reviewed and I agree with above findings/ imp/ plan  CCM service to see this w/e prn  Sandrea Hughs, MD Pulmonary and Critical Care Medicine [facility] Cell [phone]

## 2011-08-09 NOTE — Progress Notes (Signed)
Subjective: Pt states she feels better than yesterday. 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 She  Is still on venti mask. She used bipap last night. She also has a h/o sleep apnea uses CCPAP at  Night.   Objective: Weight change: 2.397 kg (5 lb 4.5 oz)  Intake/Output Summary (Last 24 hours) at 08/09/11 1010 Last data filed at 08/09/11 0700  Gross per 24 hour  Intake    680 ml  Output    400 ml  Net    280 ml    Filed Vitals:   08/09/11 0800  BP: 140/67  Pulse: 65  Temp: 99.2 F (37.3 C)  Resp: 26  pt alert , low grade fever, in mild distress cvs s1 s2 heard  Lungs bilateral wheezing and  rhonchi heard , fair air entry.  Abdomen obese , non tender, non distended, bowel sounds heard Extremities: trace edema no cyanosis or clubbing.     Lab Results: Results for orders placed during the hospital encounter of 08/07/11 (from the past 24 hour(s))  CBC     Status: Abnormal   Collection Time   08/09/11  4:40 AM      Component Value Range   WBC 6.0  4.0 - 10.5 (K/uL)   RBC 3.79 (*) 3.87 - 5.11 (MIL/uL)   Hemoglobin 10.9 (*) 12.0 - 15.0 (g/dL)   HCT 11.9 (*) 14.7 - 46.0 (%)   MCV 87.6  78.0 - 100.0 (fL)   MCH 28.8  26.0 - 34.0 (pg)   MCHC 32.8  30.0 - 36.0 (g/dL)   RDW 82.9  56.2 - 13.0 (%)   Platelets 134 (*) 150 - 400 (K/uL)  COMPREHENSIVE METABOLIC PANEL     Status: Abnormal   Collection Time   08/09/11  4:40 AM      Component Value Range   Sodium 137  135 - 145 (mEq/L)   Potassium 3.2 (*) 3.5 - 5.1 (mEq/L)   Chloride 105  96 - 112 (mEq/L)   CO2 26  19 - 32 (mEq/L)   Glucose, Bld 130 (*) 70 - 99 (mg/dL)   BUN 15  6 - 23 (mg/dL)   Creatinine, Ser 8.65  0.50 - 1.10 (mg/dL)   Calcium 8.5  8.4 - 78.4 (mg/dL)   Total Protein 5.9 (*) 6.0 - 8.3 (g/dL)   Albumin 2.8 (*) 3.5 - 5.2 (g/dL)   AST 23  0 - 37 (U/L)   ALT 24  0 - 35 (U/L)   Alkaline Phosphatase 44  39 - 117 (U/L)   Total Bilirubin 0.2 (*) 0.3 - 1.2 (mg/dL)   GFR calc non Af Amer 80 (*) >90  (mL/min)   GFR calc Af Amer >90  >90 (mL/min)     Micro Results: No results found for this or any previous visit (from the past 240 hour(s)).  Studies/Results: Dg Chest 2 View  08/07/2011  *RADIOLOGY REPORT*  Clinical Data: Cough, severe shortness of breath, fever, congestion, smoker, asthma  CHEST - 2 VIEW  Comparison: 09/15/2008  Findings: Normal heart size, mediastinal contours, and pulmonary vascularity. Minimal chronic subsegmental atelectasis versus scarring at lingula. No definite acute infiltrate, pleural effusion or pneumothorax. No acute osseous findings.  IMPRESSION: No acute abnormalities.  Original Report Authenticated By: Lollie Marrow, M.D.   Dg Chest Port 1 View  08/09/2011  *RADIOLOGY REPORT*  Clinical Data: Pneumonia.  PORTABLE CHEST - 1 VIEW  Comparison: 08/08/2011  Findings: Heart is normal size.  Diffuse  peribronchial thickening and interstitial prominence persist, unchanged.  No effusions.  No acute bony abnormality.  IMPRESSION: Stable peribronchial thickening and interstitial prominence.  Original Report Authenticated By: Cyndie Chime, M.D.   Dg Chest Port 1 View  08/08/2011  *RADIOLOGY REPORT*  Clinical Data: Pneumonia.  Short of breath.  Cough and congestion.  PORTABLE CHEST - 1 VIEW  Comparison: 08/07/2011.  Findings: Low volume chest.  Basilar atelectasis.  No airspace disease.  No effusion.  Cardiopericardial silhouette appears within normal limits. Monitoring leads are projected over the chest.  IMPRESSION: Low volume chest without acute cardiopulmonary disease.  Original Report Authenticated By: Andreas Newport, M.D.   Medications: Scheduled Meds:   . albuterol  2.5 mg Nebulization Q4H  . enoxaparin  40 mg Subcutaneous Q24H  . ipratropium  0.5 mg Nebulization Q4H  . moxifloxacin  400 mg Intravenous Q24H  . nicotine  14 mg Transdermal Daily  . oseltamivir  75 mg Oral BID  . potassium chloride  40 mEq Oral Daily  . predniSONE  60 mg Oral Q breakfast    Continuous Infusions:   . sodium chloride 50 mL/hr at 08/09/11 0400   PRN Meds:.acetaminophen, albuterol, ipratropium, morphine injection, oxyCODONE  Assessment/Plan: Patient Active Hospital Problem List: Acute respiratory failure secondary to community acquired pneumonia, copd exacerbation and influenza . Currently she is on Venti mask, we need to gradually wean her off the Venti mask to nasal canula oxygen and keep O2 saturations >90%. Continue with bronchodilator therapy, and change prednisone to iv solumedrol 60 mg Q 12 hr for her persistent wheezing.  Bipap as needed .   IV avelox day 3 for community acquired pneumonia. And tad  2 of tamiflu.  Repeat cxr this morning showed stable peri bronchial thickening and interstitial prominence.   Fever (08/07/2011) Probably secondary to influenza. Blood cultures done and results pending HTN (hypertension) (08/07/2011)  controlled.  Tobacco use disorder (08/07/2011) Actively smoking. Will start her on nicotine patch. Tobacco cessation counseling to be given.  Hypokalemia (08/07/2011) Will be repleted.    LOS: 2 days   Tanyla Stege 08/09/2011, 10:10 AM

## 2011-08-09 NOTE — Progress Notes (Signed)
   CARE MANAGEMENT NOTE 08/09/2011  Patient:  [name] [name]   Account Number:  [id number]  Date Initiated:  08/09/2011  Documentation initiated by:  Lanier Clam  Subjective/Objective Assessment:   ADMITTED W/SOB,FEVER,CHILL.     Action/Plan:   FROM HOME W/SPOUSE.   Anticipated DC Date:  08/13/2011   Anticipated DC Plan:  HOME/SELF CARE         Choice offered to / List presented to:             Status of service:  In process, will continue to follow Medicare Important Message given?   (If response is "NO", the following Medicare IM given date fields will be blank) Date Medicare IM given:   Date Additional Medicare IM given:    Discharge Disposition:    Per UR Regulation:  Reviewed for med. necessity/level of care/duration of stay  Comments:  08/11/11 [facility] [name] Ledlow RN,BSN NCM [phone]

## 2011-08-10 ENCOUNTER — Inpatient Hospital Stay (HOSPITAL_COMMUNITY): Payer: Medicare Other

## 2011-08-10 LAB — IRON AND TIBC
Iron: 43 ug/dL (ref 42–135)
Saturation Ratios: 16 % — ABNORMAL LOW (ref 20–55)
TIBC: 275 ug/dL (ref 250–470)

## 2011-08-10 LAB — COMPREHENSIVE METABOLIC PANEL
ALT: 25 U/L (ref 0–35)
Albumin: 3.1 g/dL — ABNORMAL LOW (ref 3.5–5.2)
Calcium: 8.5 mg/dL (ref 8.4–10.5)
GFR calc Af Amer: >90 mL/min (ref >90)
Glucose, Bld: 176 mg/dL — ABNORMAL HIGH (ref 70–99)
Potassium: 4.2 mEq/L (ref 3.5–5.1)
Sodium: 138 mEq/L (ref 135–145)
Total Protein: 6.4 g/dL (ref 6.0–8.3)

## 2011-08-10 LAB — FERRITIN: Ferritin: 150 ng/mL (ref 10–291)

## 2011-08-10 LAB — MAGNESIUM: Magnesium: 2.1 mg/dL (ref 1.5–2.5)

## 2011-08-10 LAB — CBC
MCV: 86.8 fL (ref 78.0–100.0)
Platelets: 159 10*3/uL (ref 150–400)
RBC: 3.85 MIL/uL — ABNORMAL LOW (ref 3.87–5.11)
RDW: 13.6 % (ref 11.5–15.5)
WBC: 7.8 10*3/uL (ref 4.0–10.5)

## 2011-08-10 LAB — PHOSPHORUS: Phosphorus: 2.7 mg/dL (ref 2.3–4.6)

## 2011-08-10 MED ORDER — DULOXETINE HCL 30 MG PO CPEP
30.0000 mg | ORAL_CAPSULE | Freq: Every day | ORAL | Status: DC
  Administered 2011-08-10 – 2011-08-13 (×4): 30 mg via ORAL
  Filled 2011-08-10 (×4): qty 1

## 2011-08-10 MED ORDER — ALBUTEROL SULFATE HFA 108 (90 BASE) MCG/ACT IN AERS
2.0000 | INHALATION_SPRAY | Freq: Four times a day (QID) | RESPIRATORY_TRACT | Status: DC | PRN
  Filled 2011-08-10: qty 6.7

## 2011-08-10 MED ORDER — SODIUM CHLORIDE 0.9 % IJ SOLN
3.0000 mL | Freq: Two times a day (BID) | INTRAMUSCULAR | Status: DC
  Administered 2011-08-10 – 2011-08-13 (×7): 3 mL via INTRAVENOUS

## 2011-08-10 MED ORDER — SIMVASTATIN 20 MG PO TABS
20.0000 mg | ORAL_TABLET | Freq: Every day | ORAL | Status: DC
  Administered 2011-08-10 – 2011-08-12 (×3): 20 mg via ORAL
  Filled 2011-08-10 (×4): qty 1

## 2011-08-10 MED ORDER — METHOCARBAMOL 500 MG PO TABS
500.0000 mg | ORAL_TABLET | Freq: Four times a day (QID) | ORAL | Status: DC
  Administered 2011-08-10 – 2011-08-13 (×10): 500 mg via ORAL
  Filled 2011-08-10 (×14): qty 1

## 2011-08-10 MED ORDER — HYDROCORTISONE 2.5 % RE CREA
TOPICAL_CREAM | Freq: Two times a day (BID) | RECTAL | Status: DC
  Administered 2011-08-10: 1 via RECTAL
  Administered 2011-08-10 – 2011-08-13 (×4): via RECTAL
  Filled 2011-08-10: qty 28.35

## 2011-08-10 MED ORDER — LORATADINE 10 MG PO TABS
10.0000 mg | ORAL_TABLET | Freq: Every day | ORAL | Status: DC
  Administered 2011-08-10 – 2011-08-13 (×4): 10 mg via ORAL
  Filled 2011-08-10 (×4): qty 1

## 2011-08-10 MED ORDER — AMLODIPINE BESYLATE 5 MG PO TABS
5.0000 mg | ORAL_TABLET | Freq: Every day | ORAL | Status: DC
  Administered 2011-08-10 – 2011-08-13 (×4): 5 mg via ORAL
  Filled 2011-08-10 (×4): qty 1

## 2011-08-10 MED ORDER — TRAZODONE HCL 100 MG PO TABS
100.0000 mg | ORAL_TABLET | Freq: Every day | ORAL | Status: DC
  Administered 2011-08-10: 200 mg via ORAL
  Administered 2011-08-11 – 2011-08-12 (×2): 100 mg via ORAL
  Filled 2011-08-10 (×4): qty 2

## 2011-08-10 NOTE — Progress Notes (Signed)
Subjective: Pt states her breathing is better than yesterday. Still coughing on deep breathing.   Objective: Weight change: 2.4 kg (5 lb 4.7 oz)  Intake/Output Summary (Last 24 hours) at 08/10/11 0903 Last data filed at 08/10/11 0600  Gross per 24 hour  Intake   1450 ml  Output   1850 ml  Net   -400 ml    Filed Vitals:   08/10/11 0800  BP:   Pulse:   Temp: 98 F (36.7 C)  Resp:    pt alert , afebrile, in no distress  cvs s1 s2 heard  Lungs bilateral wheezing better than yesterday., fair air entry.  Abdomen obese , non tender, non distended, bowel sounds heard  Extremities: trace edema no cyanosis or clubbing.   Lab Results: Results for orders placed during the hospital encounter of 08/07/11 (from the past 24 hour(s))  MRSA PCR SCREENING     Status: Normal   Collection Time   08/09/11 12:49 PM      Component Value Range   MRSA by PCR NEGATIVE  NEGATIVE   CBC     Status: Abnormal   Collection Time   08/10/11  7:30 AM      Component Value Range   WBC 7.8  4.0 - 10.5 (K/uL)   RBC 3.85 (*) 3.87 - 5.11 (MIL/uL)   Hemoglobin 11.0 (*) 12.0 - 15.0 (g/dL)   HCT 16.1 (*) 09.6 - 46.0 (%)   MCV 86.8  78.0 - 100.0 (fL)   MCH 28.6  26.0 - 34.0 (pg)   MCHC 32.9  30.0 - 36.0 (g/dL)   RDW 04.5  40.9 - 81.1 (%)   Platelets 159  150 - 400 (K/uL)  PHOSPHORUS     Status: Normal   Collection Time   08/10/11  7:30 AM      Component Value Range   Phosphorus 2.7  2.3 - 4.6 (mg/dL)  MAGNESIUM     Status: Normal   Collection Time   08/10/11  7:30 AM      Component Value Range   Magnesium 2.1  1.5 - 2.5 (mg/dL)  COMPREHENSIVE METABOLIC PANEL     Status: Abnormal   Collection Time   08/10/11  7:30 AM      Component Value Range   Sodium 138  135 - 145 (mEq/L)   Potassium 4.2  3.5 - 5.1 (mEq/L)   Chloride 104  96 - 112 (mEq/L)   CO2 25  19 - 32 (mEq/L)   Glucose, Bld 176 (*) 70 - 99 (mg/dL)   BUN 15  6 - 23 (mg/dL)   Creatinine, Ser 9.14  0.50 - 1.10 (mg/dL)   Calcium 8.5  8.4 - 78.2  (mg/dL)   Total Protein 6.4  6.0 - 8.3 (g/dL)   Albumin 3.1 (*) 3.5 - 5.2 (g/dL)   AST 17  0 - 37 (U/L)   ALT 25  0 - 35 (U/L)   Alkaline Phosphatase 47  39 - 117 (U/L)   Total Bilirubin 0.2 (*) 0.3 - 1.2 (mg/dL)   GFR calc non Af Amer >90  >90 (mL/min)   GFR calc Af Amer >90  >90 (mL/min)     Micro Results: Recent Results (from the past 240 hour(s))  MRSA PCR SCREENING     Status: Normal   Collection Time   08/09/11 12:49 PM      Component Value Range Status Comment   MRSA by PCR NEGATIVE  NEGATIVE  Final     Studies/Results:  Dg Chest 2 View  08/07/2011  *RADIOLOGY REPORT*  Clinical Data: Cough, severe shortness of breath, fever, congestion, smoker, asthma  CHEST - 2 VIEW  Comparison: 09/15/2008  Findings: Normal heart size, mediastinal contours, and pulmonary vascularity. Minimal chronic subsegmental atelectasis versus scarring at lingula. No definite acute infiltrate, pleural effusion or pneumothorax. No acute osseous findings.  IMPRESSION: No acute abnormalities.  Original Report Authenticated By: Lollie Marrow, M.D.   Dg Chest Port 1 View  08/10/2011  *RADIOLOGY REPORT*  Clinical Data: Pneumonia and COPD  PORTABLE CHEST - 1 VIEW  Comparison: Yesterday  Findings: Bronchitic changes are stable.  No consolidation.  No pneumothorax.  Normal heart size.  IMPRESSION: Bronchitic changes.  Stable.  Original Report Authenticated By: Donavan Burnet, M.D.   Dg Chest Port 1 View  08/09/2011  *RADIOLOGY REPORT*  Clinical Data: Pneumonia.  PORTABLE CHEST - 1 VIEW  Comparison: 08/08/2011  Findings: Heart is normal size.  Diffuse peribronchial thickening and interstitial prominence persist, unchanged.  No effusions.  No acute bony abnormality.  IMPRESSION: Stable peribronchial thickening and interstitial prominence.  Original Report Authenticated By: Cyndie Chime, M.D.   Dg Chest Port 1 View  08/08/2011  *RADIOLOGY REPORT*  Clinical Data: Pneumonia.  Short of breath.  Cough and congestion.   PORTABLE CHEST - 1 VIEW  Comparison: 08/07/2011.  Findings: Low volume chest.  Basilar atelectasis.  No airspace disease.  No effusion.  Cardiopericardial silhouette appears within normal limits. Monitoring leads are projected over the chest.  IMPRESSION: Low volume chest without acute cardiopulmonary disease.  Original Report Authenticated By: Andreas Newport, M.D.   Medications: Scheduled Meds:   . albuterol  2.5 mg Nebulization Q4H  . enoxaparin  40 mg Subcutaneous Q24H  . hydrocortisone   Rectal BID  . ipratropium  0.5 mg Nebulization Q4H  . methylPREDNISolone (SOLU-MEDROL) injection  80 mg Intravenous Q12H  . methylPREDNISolone sodium succinate      . moxifloxacin  400 mg Intravenous Q24H  . nicotine  14 mg Transdermal Daily  . oseltamivir  75 mg Oral BID  . pantoprazole  40 mg Oral BID AC  . potassium chloride  10 mEq Intravenous Q1 Hr x 3  . potassium chloride      . sodium chloride  3 mL Intravenous Q12H  . DISCONTD: methylPREDNISolone (SOLU-MEDROL) injection  60 mg Intravenous Q12H  . DISCONTD: potassium chloride  40 mEq Oral Daily  . DISCONTD: predniSONE  60 mg Oral Q breakfast   Continuous Infusions:   . DISCONTD: sodium chloride 50 mL/hr at 08/09/11 0400   PRN Meds:.acetaminophen, albuterol, ipratropium, morphine injection, oxyCODONE, DISCONTD: oxyCODONE  Assessment/Plan: Patient Active Hospital Problem List: Acute respiratory failure secondary to community acquired pneumonia, copd exacerbation and influenza .  Currently she is on Venti mask, we need to gradually wean her off the Venti mask to nasal [place] oxygen and keep O2 saturations >90%. Continue with bronchodilator therapy, and change prednisone to iv solumedrol 60 mg Q 12 hr for her persistent wheezing.  Bipap as needed .  IV avelox day 4 for community acquired pneumonia. Continue with tamiflu to complete 5 day course.   Repeat x ray shows bronchitic changes.    Fever (08/07/2011) Probably secondary to influenza.  Blood cultures done and results pending HTN (hypertension) (08/07/2011) controlled. Restart her home medications.  Tobacco use disorder (08/07/2011) Actively smoking. Will start her on nicotine patch. Tobacco cessation counseling to be given.  Hypokalemia (08/07/2011)  repleted.     LOS: 3  days   Gemayel Mascio 08/10/2011, 9:03 AM

## 2011-08-10 NOTE — Plan of Care (Signed)
Problem: ICU Phase Progression Outcomes Goal: Hemodynamically stable Outcome: Completed/Met Date Met:  08/10/11 VSS despite work of breathing.  sats remain 100%, pt has low reserve.

## 2011-08-11 LAB — CBC
HCT: 33.5 % — ABNORMAL LOW (ref 36.0–46.0)
Hemoglobin: 11.1 g/dL — ABNORMAL LOW (ref 12.0–15.0)
MCH: 28.8 pg (ref 26.0–34.0)
MCHC: 33.1 g/dL (ref 30.0–36.0)
MCV: 86.8 fL (ref 78.0–100.0)
Platelets: 151 K/uL (ref 150–400)
RBC: 3.86 MIL/uL — ABNORMAL LOW (ref 3.87–5.11)
RDW: 13.5 % (ref 11.5–15.5)
WBC: 10.1 K/uL (ref 4.0–10.5)

## 2011-08-11 LAB — BASIC METABOLIC PANEL
BUN: 15 mg/dL (ref 6–23)
CO2: 23 mEq/L (ref 19–32)
Calcium: 8.7 mg/dL (ref 8.4–10.5)
Chloride: 107 mEq/L (ref 96–112)
Creatinine, Ser: 0.66 mg/dL (ref 0.50–1.10)
GFR calc Af Amer: >90 mL/min (ref >90)
GFR calc non Af Amer: >90 mL/min (ref >90)
Glucose, Bld: 185 mg/dL — ABNORMAL HIGH (ref 70–99)
Potassium: 4.2 mEq/L (ref 3.5–5.1)
Sodium: 140 mEq/L (ref 135–145)

## 2011-08-11 MED ORDER — METHYLPREDNISOLONE SODIUM SUCC 125 MG IJ SOLR
60.0000 mg | Freq: Two times a day (BID) | INTRAMUSCULAR | Status: DC
  Administered 2011-08-11 – 2011-08-12 (×2): 60 mg via INTRAVENOUS
  Filled 2011-08-11 (×6): qty 0.96

## 2011-08-11 NOTE — Progress Notes (Signed)
Subjective: Breathing improved Feels much better  Objective: Weight change:   Intake/Output Summary (Last 24 hours) at 08/11/11 2101 Last data filed at 08/11/11 1830  Gross per 24 hour  Intake    793 ml  Output   3000 ml  Net  -2207 ml    Filed Vitals:   08/11/11 1500  BP: 134/77  Pulse: 77  Temp: 98.5 F (36.9 C)  Resp: 19  pt alert , afebrile, in no distress  cvs s1 s2 heard  Lungs No wheezing heard. Scattered rhonchi on both sides  Abdomen obese , non tender, non distended, bowel sounds heard  Extremities: trace edema no cyanosis or clubbing.      Lab Results: Results for orders placed during the hospital encounter of 08/07/11 (from the past 24 hour(s))  CBC     Status: Abnormal   Collection Time   08/11/11  8:50 AM      Component Value Range   WBC 10.1  4.0 - 10.5 (K/uL)   RBC 3.86 (*) 3.87 - 5.11 (MIL/uL)   Hemoglobin 11.1 (*) 12.0 - 15.0 (g/dL)   HCT 09.8 (*) 11.9 - 46.0 (%)   MCV 86.8  78.0 - 100.0 (fL)   MCH 28.8  26.0 - 34.0 (pg)   MCHC 33.1  30.0 - 36.0 (g/dL)   RDW 14.7  82.9 - 56.2 (%)   Platelets 151  150 - 400 (K/uL)  BASIC METABOLIC PANEL     Status: Abnormal   Collection Time   08/11/11  8:50 AM      Component Value Range   Sodium 140  135 - 145 (mEq/L)   Potassium 4.2  3.5 - 5.1 (mEq/L)   Chloride 107  96 - 112 (mEq/L)   CO2 23  19 - 32 (mEq/L)   Glucose, Bld 185 (*) 70 - 99 (mg/dL)   BUN 15  6 - 23 (mg/dL)   Creatinine, Ser 1.30  0.50 - 1.10 (mg/dL)   Calcium 8.7  8.4 - 86.5 (mg/dL)   GFR calc non Af Amer >90  >90 (mL/min)   GFR calc Af Amer >90  >90 (mL/min)  MAGNESIUM     Status: Normal   Collection Time   08/11/11  8:50 AM      Component Value Range   Magnesium 2.3  1.5 - 2.5 (mg/dL)     Micro Results: Recent Results (from the past 240 hour(s))  MRSA PCR SCREENING     Status: Normal   Collection Time   08/09/11 12:49 PM      Component Value Range Status Comment   MRSA by PCR NEGATIVE  NEGATIVE  Final      Studies/Results: Dg Chest 2 View  08/07/2011  *RADIOLOGY REPORT*  Clinical Data: Cough, severe shortness of breath, fever, congestion, smoker, asthma  CHEST - 2 VIEW  Comparison: 09/15/2008  Findings: Normal heart size, mediastinal contours, and pulmonary vascularity. Minimal chronic subsegmental atelectasis versus scarring at lingula. No definite acute infiltrate, pleural effusion or pneumothorax. No acute osseous findings.  IMPRESSION: No acute abnormalities.  Original Report Authenticated By: Lollie Marrow, M.D.   Dg Chest Port 1 View  08/10/2011  *RADIOLOGY REPORT*  Clinical Data: Pneumonia and COPD  PORTABLE CHEST - 1 VIEW  Comparison: Yesterday  Findings: Bronchitic changes are stable.  No consolidation.  No pneumothorax.  Normal heart size.  IMPRESSION: Bronchitic changes.  Stable.  Original Report Authenticated By: Donavan Burnet, M.D.   Dg Chest Port 1 View  08/09/2011  *  RADIOLOGY REPORT*  Clinical Data: Pneumonia.  PORTABLE CHEST - 1 VIEW  Comparison: 08/08/2011  Findings: Heart is normal size.  Diffuse peribronchial thickening and interstitial prominence persist, unchanged.  No effusions.  No acute bony abnormality.  IMPRESSION: Stable peribronchial thickening and interstitial prominence.  Original Report Authenticated By: Cyndie Chime, M.D.   Dg Chest Port 1 View  08/08/2011  *RADIOLOGY REPORT*  Clinical Data: Pneumonia.  Short of breath.  Cough and congestion.  PORTABLE CHEST - 1 VIEW  Comparison: 08/07/2011.  Findings: Low volume chest.  Basilar atelectasis.  No airspace disease.  No effusion.  Cardiopericardial silhouette appears within normal limits. Monitoring leads are projected over the chest.  IMPRESSION: Low volume chest without acute cardiopulmonary disease.  Original Report Authenticated By: Andreas Newport, M.D.   Medications: Scheduled Meds:   . albuterol  2.5 mg Nebulization Q4H  . amLODipine  5 mg Oral Daily  . DULoxetine  30 mg Oral Daily  . enoxaparin  40 mg  Subcutaneous Q24H  . hydrocortisone   Rectal BID  . ipratropium  0.5 mg Nebulization Q4H  . loratadine  10 mg Oral Daily  . methocarbamol  500 mg Oral QID  . methylPREDNISolone (SOLU-MEDROL) injection  60 mg Intravenous Q12H  . moxifloxacin  400 mg Intravenous Q24H  . nicotine  14 mg Transdermal Daily  . oseltamivir  75 mg Oral BID  . pantoprazole  40 mg Oral BID AC  . simvastatin  20 mg Oral q1800  . sodium chloride  3 mL Intravenous Q12H  . traZODone  100-200 mg Oral QHS  . DISCONTD: methylPREDNISolone (SOLU-MEDROL) injection  80 mg Intravenous Q12H   Continuous Infusions:  PRN Meds:.acetaminophen, albuterol, albuterol, ipratropium, morphine injection, oxyCODONE  Assessment/Plan: Patient Active Hospital Problem List: Acute respiratory failuresecondary to community acquired pneumonia, copd exacerbation and influenza .  Currently she is on nasal cannula Continue with bronchodilator therapy Taper steroids Bipap as needed .  IV avelox day 5 for community acquired pneumonia.  Continue with tamiflu to complete 5 day course.  Repeat x ray shows bronchitic changes.   Fever (08/07/2011) Resolved Probably secondary to influenza.  Blood cultures done and results pending HTN (hypertension) (08/07/2011) controlled. Restart her home medications.  Tobacco use disorder (08/07/2011) Continue nicotine patch. Tobacco cessation counseling to be given.  Hypokalemia (08/07/2011) repleted.      LOS: 4 days   [name] [name] 08/11/2011, 9:01 PM

## 2011-08-12 DIAGNOSIS — J13 Pneumonia due to Streptococcus pneumoniae: Secondary | ICD-10-CM

## 2011-08-12 DIAGNOSIS — J1100 Influenza due to unidentified influenza virus with unspecified type of pneumonia: Secondary | ICD-10-CM

## 2011-08-12 MED ORDER — MOXIFLOXACIN HCL 400 MG PO TABS
400.0000 mg | ORAL_TABLET | Freq: Every day | ORAL | Status: DC
  Administered 2011-08-12: 400 mg via ORAL
  Filled 2011-08-12 (×2): qty 1

## 2011-08-12 MED ORDER — METHYLPREDNISOLONE SODIUM SUCC 125 MG IJ SOLR
60.0000 mg | Freq: Two times a day (BID) | INTRAMUSCULAR | Status: AC
  Administered 2011-08-12: 60 mg via INTRAVENOUS
  Filled 2011-08-12: qty 0.96

## 2011-08-12 MED ORDER — PREDNISONE 50 MG PO TABS
60.0000 mg | ORAL_TABLET | Freq: Every day | ORAL | Status: DC
  Administered 2011-08-13: 60 mg via ORAL
  Filled 2011-08-12: qty 1

## 2011-08-12 NOTE — Progress Notes (Signed)
   LOS: 5  PCCM PROGRESS  NOTE [name] [name] is a 56 y.o. female smoker admitted on 08/07/2011 with fever, body ache, cough, sore throat.  Influenza A PCR positive.  Noted to have LLL infiltrate on CXR 1/9 and asthmatic bronchitis, so PCCM consulted.  Antibiotics: avelox (cap)1/9>>> tamiflu (cap 1/9>>>   Tests / Events: Feels better.  Still has cough with congestion.  Breathing okay at rest, but still winded with activity.  Vital Signs: Temp:  [98 F (36.7 C)-98.5 F (36.9 C)] 98 F (36.7 C) (01/14 0455) Pulse Rate:  [74-77] 74  (01/14 0455) Resp:  [19-20] 20  (01/14 0455) BP: (133-145)/(77-82) 133/80 mmHg (01/14 0455) SpO2:  [84 %-96 %] 96 % (01/14 1246) I/O last 3 completed shifts: In: 793 [P.O.:540; I.V.:3; IV Piggyback:250] Out: 3600 [Urine:3600]     Physical Examination: General - No distress HEENT - no sinus tenderness Cardiac - s1s2 regular, no murmur Chest - scattered rhonchi, no wheeze Abd - soft, nontender Ext - no edema Neuro - normal strength    Labs  Lab 08/11/11 0850 08/10/11 0730 08/09/11 0440  NA 140 138 137  K 4.2 4.2 3.2*  CL 107 104 105  CO2 23 25 26   BUN 15 15 15   CREATININE 0.66 0.63 0.80  GLUCOSE 185* 176* 130*    Lab 08/11/11 0850 08/10/11 0730 08/09/11 0440  HGB 11.1* 11.0* 10.9*  HCT 33.5* 33.4* 33.2*  WBC 10.1 7.8 6.0  PLT 151 159 134*   Assessment and Plan: Influenza pneumonia  -improving -defer tamiflu, avelox to primary team  Acute bronchitis with hx of tobacco abuse -improving -can likely transition to prednisone po -continue bronchodilator therapy -further evaluation for COPD can be done as outpt -needs to stop smoking  Acute respiratory failure 2nd to PNA and bronchitis -will need assessment for home oxygen  She is slowly improving, and will likely be ready for d/c home in next few days.  Her primary care is with [facility], and she can f/u with primary care as outpt after d/c.  Can then decide if additional f/u with  pulmonary service is needed.  Otherwise will defer further inpatient management to hospitalist team.  PCCM will sign off.  Please call if additional help needed.  Coralyn Helling, MD 08/12/2011, 1:27 PM Pager:  [phone]

## 2011-08-12 NOTE — Progress Notes (Signed)
PHARMACIST - PHYSICIAN COMMUNICATION DR:   Blake Divine CONCERNING: Antibiotic IV to Oral Route Change Policy  RECOMMENDATION: This patient is receiving Avelox by the intravenous route.  Based on criteria approved by the Pharmacy and Therapeutics Committee, the antibiotic(s) is/are being converted to the equivalent oral dose form(s).  Please consider adding a stop date for antibiotics and Tamiflu.  DESCRIPTION: These criteria include:  Patient being treated for a respiratory tract infection, urinary tract infection, or cellulitis  The patient is not neutropenic and does not exhibit a GI malabsorption state  The patient is eating (either orally or via tube) and/or has been taking other orally administered medications for a least 24 hours  The patient is improving clinically and has a Tmax < 100.5  If you have questions about this conversion, please contact the Pharmacy Department   [phone] )  [facility]   Lynann Beaver PharmD   Pager [phone] 08/12/2011 3:19 PM

## 2011-08-12 NOTE — Progress Notes (Signed)
Physical Therapy Evaluation Patient Details Name: [name] [name] MRN: [id number] DOB: 06-01-1956 Today's Date: 08/12/2011 [id number] eval 2  Problem List:  Patient Active Problem List  Diagnoses  . Hypoxemia  . COPD with acute exacerbation  . Fever  . Chronic restrictive lung disease  . HTN (hypertension)  . Tobacco use disorder  . Pneumonia, community acquired  . Hypokalemia    Past Medical History:  Past Medical History  Diagnosis Date  . Restrictive lung disease   . Sleep apnea   . Hypertension   . Depression   . Anxiety    Past Surgical History:  Past Surgical History  Procedure Date  . Abdominal hysterectomy   . Cholecystectomy   . Nasal sinus surgery   . Tonsillectomy     PT Assessment/Plan/Recommendation PT Assessment Clinical Impression Statement: Pt will benefit from PT to maximize independence for home setting; Resting O2 sats 92-93% on 2L; Sats decreased to 84% on RA with amb; returned to 91% with O2 at 2L and pursed lip breathing; PT Recommendation/Assessment: Patient will need skilled PT in the acute care venue PT Problem List: Decreased activity tolerance;Cardiopulmonary status limiting activity PT Therapy Diagnosis : Difficulty walking PT Plan PT Frequency: Min 3X/week PT Treatment/Interventions: Gait training;Stair training;Functional mobility training;Therapeutic activities;Therapeutic exercise;Patient/family education PT Recommendation Follow Up Recommendations: Home health PT Equipment Recommended: None recommended by PT PT Goals  Acute Rehab PT Goals PT Goal Formulation: With patient Pt will go Supine/Side to Sit: with supervision PT Goal: Supine/Side to Sit - Progress: Progressing toward goal Pt will go Sit to Supine/Side: with supervision PT Goal: Sit to Supine/Side - Progress: Progressing toward goal Pt will go Sit to Stand: Independently PT Goal: Sit to Stand - Progress: Progressing toward goal Pt will go Stand to Sit: Independently PT  Goal: Stand to Sit - Progress: Progressing toward goal Pt will Ambulate: 16 - 50 feet;with supervision;Other (comment) (no AD) PT Goal: Ambulate - Progress: Progressing toward goal Pt will Go Up / Down Stairs: 1-2 stairs;with least restrictive assistive device;with min assist PT Goal: Up/Down Stairs - Progress: Not met  PT Evaluation Precautions/Restrictions  Precautions Precaution Comments: O2  Prior Functioning  Home Living Lives With: Spouse (prn) Receives Help From: Family Type of Home: House Home Layout: One level Home Access: Stairs to enter Entrance Stairs-Rails: None (iron trellis that pt holds onto) Entrance Stairs-Number of Steps: 2 ; second step is very high Home Adaptive Equipment: Straight cane;Crutches Prior Function Level of Independence: Independent with homemaking with ambulation;Independent with gait;Independent with transfers Driving: Yes Vocation: On disability Cognition Cognition Arousal/Alertness: Awake/alert Overall Cognitive Status: Appears within functional limits for tasks assessed Orientation Level: Oriented X4 Sensation/Coordination   Extremity Assessment RUE Assessment RUE Assessment: Within Functional Limits LUE Assessment LUE Assessment: Within Functional Limits RLE Assessment RLE Assessment: Within Functional Limits LLE Assessment LLE Assessment: Within Functional Limits Mobility (including Balance) Bed Mobility Supine to Sit: 4: Min assist Supine to Sit Details (indicate cue type and reason): min/guard for UB; increased time Sitting - Scoot to Edge of Bed: 5: Supervision Sit to Supine: 4: Min assist Sit to Supine - Details (indicate cue type and reason): min/guard and increased time Transfers Sit to Stand: 4: Min assist Sit to Stand Details (indicate cue type and reason): min/guard and verbal cues for hands Stand to Sit: 5: Supervision Stand to Sit Details: cues for hands Ambulation/Gait Ambulation/Gait: Yes Ambulation/Gait  Assistance: 4: Min assist Ambulation/Gait Assistance Details (indicate cue type and reason): min/guard; cues for RW use posture  and breathing Ambulation Distance (Feet): 40 Feet (8 feet with no AD and min/guard) Assistive device: Rolling walker Gait Pattern: Step-through pattern Gait velocity: slow    Exercise    End of Session PT - End of Session Activity Tolerance: Patient limited by fatigue;Treatment limited secondary to medical complications (Comment) (O2 sats) Patient left: in bed;Other (comment) (NO CHAIR available) General Behavior During Session: [facility] for tasks performed Cognition: [facility] for tasks performed  [facility] 08/12/2011, 12:33 PM

## 2011-08-12 NOTE — Progress Notes (Signed)
Subjective: Improving   Objective: Weight change:   Intake/Output Summary (Last 24 hours) at 08/12/11 1957 Last data filed at 08/12/11 1847  Gross per 24 hour  Intake    843 ml  Output   1650 ml  Net   -807 ml    Filed Vitals:   08/12/11 1359  BP: 146/84  Pulse: 69  Temp: 97.4 F (36.3 C)  Resp: 19   pt alert , afebrile, in no distress  cvs s1 s2 heard  Lungs No wheezing heard. Scattered rhonchi on both sides  Abdomen obese , non tender, non distended, bowel sounds heard  Extremities: trace edema no cyanosis or clubbing.    Lab Results: No results found for this or any previous visit (from the past 24 hour(s)).   Micro Results: Recent Results (from the past 240 hour(s))  MRSA PCR SCREENING     Status: Normal   Collection Time   08/09/11 12:49 PM      Component Value Range Status Comment   MRSA by PCR NEGATIVE  NEGATIVE  Final     Studies/Results: Dg Chest 2 View  08/07/2011  *RADIOLOGY REPORT*  Clinical Data: Cough, severe shortness of breath, fever, congestion, smoker, asthma  CHEST - 2 VIEW  Comparison: 09/15/2008  Findings: Normal heart size, mediastinal contours, and pulmonary vascularity. Minimal chronic subsegmental atelectasis versus scarring at lingula. No definite acute infiltrate, pleural effusion or pneumothorax. No acute osseous findings.  IMPRESSION: No acute abnormalities.  Original Report Authenticated By: Lollie Marrow, M.D.   Dg Chest Port 1 View  08/10/2011  *RADIOLOGY REPORT*  Clinical Data: Pneumonia and COPD  PORTABLE CHEST - 1 VIEW  Comparison: Yesterday  Findings: Bronchitic changes are stable.  No consolidation.  No pneumothorax.  Normal heart size.  IMPRESSION: Bronchitic changes.  Stable.  Original Report Authenticated By: Donavan Burnet, M.D.   Dg Chest Port 1 View  08/09/2011  *RADIOLOGY REPORT*  Clinical Data: Pneumonia.  PORTABLE CHEST - 1 VIEW  Comparison: 08/08/2011  Findings: Heart is normal size.  Diffuse peribronchial thickening and  interstitial prominence persist, unchanged.  No effusions.  No acute bony abnormality.  IMPRESSION: Stable peribronchial thickening and interstitial prominence.  Original Report Authenticated By: Cyndie Chime, M.D.   Dg Chest Port 1 View  08/08/2011  *RADIOLOGY REPORT*  Clinical Data: Pneumonia.  Short of breath.  Cough and congestion.  PORTABLE CHEST - 1 VIEW  Comparison: 08/07/2011.  Findings: Low volume chest.  Basilar atelectasis.  No airspace disease.  No effusion.  Cardiopericardial silhouette appears within normal limits. Monitoring leads are projected over the chest.  IMPRESSION: Low volume chest without acute cardiopulmonary disease.  Original Report Authenticated By: Andreas Newport, M.D.   Medications: Scheduled Meds:   . albuterol  2.5 mg Nebulization Q4H  . amLODipine  5 mg Oral Daily  . DULoxetine  30 mg Oral Daily  . enoxaparin  40 mg Subcutaneous Q24H  . hydrocortisone   Rectal BID  . ipratropium  0.5 mg Nebulization Q4H  . loratadine  10 mg Oral Daily  . methocarbamol  500 mg Oral QID  . methylPREDNISolone (SOLU-MEDROL) injection  60 mg Intravenous Q12H  . moxifloxacin  400 mg Oral q1800  . nicotine  14 mg Transdermal Daily  . pantoprazole  40 mg Oral BID AC  . predniSONE  60 mg Oral QAC breakfast  . simvastatin  20 mg Oral q1800  . sodium chloride  3 mL Intravenous Q12H  . traZODone  100-200 mg  Oral QHS  . DISCONTD: methylPREDNISolone (SOLU-MEDROL) injection  60 mg Intravenous Q12H  . DISCONTD: moxifloxacin  400 mg Intravenous Q24H  . DISCONTD: oseltamivir  75 mg Oral BID   Continuous Infusions:  PRN Meds:.acetaminophen, albuterol, albuterol, ipratropium, morphine injection, oxyCODONE  Assessment/Plan: Patient Active Hospital Problem List: Acute respiratory failuresecondary to community acquired pneumonia, copd exacerbation and influenza .  Currently she is on nasal cannula  Continue with bronchodilator therapy  Taper steroids  Bipap as needed .  IV avelox day  6 for community acquired pneumonia.  Completed 5 days of tamiflu.  Repeat x ray shows bronchitic changes.   Fever (08/07/2011)  Resolved Blood cultures negative HTN (hypertension) (08/07/2011) controlled. Restart her home medications.  Tobacco use disorder (08/07/2011) Continue nicotine patch. Tobacco cessation counseling to be given.  Hypokalemia (08/07/2011) repleted.     LOS: 5 days   [name] [name] 08/12/2011, 7:57 PM

## 2011-08-13 MED ORDER — MOXIFLOXACIN HCL 400 MG PO TABS: 400.0000 mg | ORAL_TABLET | Freq: Every day | ORAL | Status: DC

## 2011-08-13 MED ORDER — ALBUTEROL SULFATE (5 MG/ML) 0.5% IN NEBU: 2.5000 mg | INHALATION_SOLUTION | RESPIRATORY_TRACT | Status: DC | PRN

## 2011-08-13 MED ORDER — NICOTINE 14 MG/24HR TD PT24: 1.0000 | MEDICATED_PATCH | Freq: Every day | TRANSDERMAL | Status: AC

## 2011-08-13 MED ORDER — IPRATROPIUM BROMIDE 0.02 % IN SOLN: 0.5000 mg | RESPIRATORY_TRACT | Status: DC

## 2011-08-13 MED ORDER — MOXIFLOXACIN HCL 400 MG PO TABS: 400.0000 mg | ORAL_TABLET | Freq: Every day | ORAL | Status: AC

## 2011-08-13 MED ORDER — PREDNISONE 20 MG PO TABS: ORAL_TABLET | ORAL | Status: DC

## 2011-08-13 MED ORDER — PANTOPRAZOLE SODIUM 40 MG PO TBEC: 40.0000 mg | DELAYED_RELEASE_TABLET | Freq: Every day | ORAL | Status: DC

## 2011-08-13 MED ORDER — IPRATROPIUM BROMIDE 0.02 % IN SOLN: 0.5000 mg | RESPIRATORY_TRACT | Status: DC | PRN

## 2011-08-13 MED ORDER — NICOTINE 14 MG/24HR TD PT24: 1.0000 | MEDICATED_PATCH | Freq: Every day | TRANSDERMAL | Status: DC

## 2011-08-13 NOTE — Discharge Summary (Signed)
DISCHARGE SUMMARY  [name] [name]  MR#: [id number]  DOB:05-12-1956  Date of Admission: 08/07/2011 Date of Discharge: 08/13/2011  Attending Physician:Evora Schechter  Patient's HQI:ONGEXB,MWUXLKG J, MD  Consults:pULMONARY CONSULT  Discharge Diagnoses: Acute respiratory failure  Resolved.  .Hypoxemia .COPD with acute exacerbation .Fever .HTN (hypertension) .Tobacco use disorder .Pneumonia, community acquired .Hypokalemia Influenza A positive Hyperlipidemia GERD Familiar Tremor.     Current Discharge Medication List    START taking these medications   Details  albuterol (PROVENTIL) (5 MG/ML) 0.5% nebulizer solution Take 0.5 mLs (2.5 mg total) by nebulization every 4 (four) hours as needed for wheezing or shortness of breath. Qty: 20 mL, Refills: 1    ipratropium (ATROVENT) 0.02 % nebulizer solution Take 2.5 mLs (0.5 mg total) by nebulization every 4 (four) hours as needed. Qty: 75 mL, Refills: 1    moxifloxacin (AVELOX) 400 MG tablet Take 1 tablet (400 mg total) by mouth daily at 6 PM. Qty: 5 tablet, Refills: 0    nicotine (NICODERM CQ - DOSED IN MG/24 HOURS) 14 mg/24hr patch Place 1 patch onto the skin daily. Qty: 28 patch, Refills: 1    pantoprazole (PROTONIX) 40 MG tablet Take 1 tablet (40 mg total) by mouth daily. Qty: 30 tablet, Refills: 0    predniSONE (DELTASONE) 20 MG tablet PREDNISONE 60MG  DAILY FOR 3 DAYS followed by  Prednisone 40mg  daily for 3 days followed by Prednisone 30mg  daily for 3 days followed by Prednisone 20mg  daily  For 3 days followed by Prednisone 10mg  daily for 3 days. Qty: 25 tablet, Refills: 0      CONTINUE these medications which have NOT CHANGED   Details  albuterol (PROVENTIL HFA;VENTOLIN HFA) 108 (90 BASE) MCG/ACT inhaler Inhale 2 puffs into the lungs every 6 (six) hours as needed. wheezing    amLODipine (NORVASC) 5 MG tablet Take 5 mg by mouth daily.    cetirizine (ZYRTEC) 10 MG tablet Take 10 mg by mouth daily.    DULoxetine  (CYMBALTA) 30 MG capsule Take 30 mg by mouth daily.    methocarbamol (ROBAXIN) 500 MG tablet Take 500 mg by mouth 4 (four) times daily.    pravastatin (PRAVACHOL) 20 MG tablet Take 20 mg by mouth daily.    traZODone (DESYREL) 100 MG tablet Take 100-200 mg by mouth at bedtime.    valsartan-hydrochlorothiazide (DIOVAN-HCT) 320-25 MG per tablet Take 1 tablet by mouth daily.       Brief Hospital Note: 56 year old lady admitted for fever, chills and difficulty breathing found to be flu positive, pneumonia and copd exacerbation. She was initially admitted to tele floor. But her breathing got worse and she was transferred to step down for closer monitoring and PCCM consult was obtained. She was started on iv steroids, iv antibiotics and tamiflu. Her breathing improved over the course of hospitalization and she is being disacharged home with home pt and home oxygen. And recommended to follow up with pcp and pulmonologist.   Hospital Course:  Acute respiratory failuresecondary to community acquired pneumonia, copd exacerbation and influenza .  Resolved. She re quired nasal oxygen on ambulation. She is discharged home on  Steroid taper, and avelox to complete the course of antibiotics. She is discharged home on nebulizations as needed.   Fever (08/07/2011)  Resolved  Blood cultures  Have been negative so far.  HTN (hypertension) (08/07/2011) controlled. Restart her home medication son discharge.   Tobacco use disorder (08/07/2011) Continue nicotine patch. Tobacco cessation counseling given.  Hypokalemia (08/07/2011) repleted.    Day  of Discharge BP 157/89  Pulse 71  Temp(Src) 98.1 F (36.7 C) (Oral)  Resp 18  Ht 5\' 8"  (1.727 m)  Wt 125 kg (275 lb 9.2 oz)  BMI 41.90 kg/m2  SpO2 93%  Physical Exam: pt alert , afebrile, in no distress  cvs s1 s2 heard  Lungs No wheezing heard. Good air entry bilateral Abdomen obese , non tender, non distended, bowel sounds heard  Extremities: trace edema no  cyanosis or clubbing.    No results found for this or any previous visit (from the past 24 hour(s)).  Disposition: Home with Home PT.   Follow-up Appts: Discharge Orders    Future Orders Please Complete By Expires   For home use only DME oxygen      Comments:   2 liters/minute.   Diet - low sodium heart healthy      Increase activity slowly      Discharge instructions      Comments:   Follow up PCP in one week  Follow up with Pulmonologist in 1 to 2 weeks and get outpatient pulmonary function tests. Smoking cessation.      Follow-up Information    Follow up with [facility]. (for oxygen & physical therapy)    Contact information:   [address] [place] [address] [phone]      Follow up with [facility].   Contact information:   [address] [place] [address] [phone]          Tests Needing Follow-up: pulmonary function tests in 2 to 4 weeks at pulmonologist.    Time spent in discharge (includes decision making & examination of pt): 48 minutes  Signed: Leotha Westermeyer 08/13/2011, 1:23 PM

## 2011-08-13 NOTE — Progress Notes (Signed)
Spoke with patient at bedside, states she lives at home with spouse. He does most of the cooking, she manages meds, has PCP, transportation. Per recommendations will need home O2 and PT at d/c. Patient wants to use [facility], states that her husband is on home O2 provided by [facility]. Contacted Darl Pikes with [facility] to arrange. No other d/c needs identified, patient appreciative of visit, will continue to follow.

## 2011-08-13 NOTE — Progress Notes (Signed)
Patient discharge to home, with Home O2, alert and oriented no distress noted upon d/c. PIV removed no s/s of infection, no swelling, no redness. Discharge instructions given to patient verbalized understanding.

## 2011-08-26 ENCOUNTER — Ambulatory Visit (INDEPENDENT_AMBULATORY_CARE_PROVIDER_SITE_OTHER): Payer: Medicare Other | Admitting: Critical Care Medicine

## 2011-08-26 ENCOUNTER — Encounter: Payer: Self-pay | Admitting: Critical Care Medicine

## 2011-08-26 DIAGNOSIS — G4733 Obstructive sleep apnea (adult) (pediatric): Secondary | ICD-10-CM

## 2011-08-26 DIAGNOSIS — E785 Hyperlipidemia, unspecified: Secondary | ICD-10-CM | POA: Insufficient documentation

## 2011-08-26 DIAGNOSIS — J449 Chronic obstructive pulmonary disease, unspecified: Secondary | ICD-10-CM

## 2011-08-26 MED ORDER — TIOTROPIUM BROMIDE MONOHYDRATE 18 MCG IN CAPS: 18.0000 ug | ORAL_CAPSULE | Freq: Every day | RESPIRATORY_TRACT | Status: DC

## 2011-08-26 NOTE — Progress Notes (Signed)
Subjective:    Patient ID: [name] [name], female    DOB: [birth year], 56 y.o.   MRN: [id number]  HPI 57 y.o.WF post hosp for PNA/COPD    Pt had PNA, copd exac,  ILI pos influenza A.   Hx of smoking  Pt in hosp 1/9 - 1/15 Pt seen by PCCM as consult at [facility]  Pt was on bilevel with acute resp failure. Since d/c dyspnea is slowly better, still hoarse.  Notes sl cough.  Feels tired all the time.  No real chest pain.  No edema in the feet.  No pndrip.   Pt d/c on BD neb meds, steroids, avelox, protonix, nicoderm patch.  Pt trying not to smoke now.  Past Medical History  Diagnosis Date  . Restrictive lung disease   . Sleep apnea   . Hypertension   . Depression   . Anxiety   . COPD (chronic obstructive pulmonary disease)   . Hyperlipidemia      Family History  Problem Relation Age of Onset  . Lung cancer Father   . COPD Father   . Heart attack Mother   . Hypertension Mother   . Hypertension Father   . Heart attack Father      History   Social History  . Marital Status: Married    Spouse Name: N/A    Number of Children: 0  . Years of Education: N/A   Occupational History  . disability     RN at [employer]   Social History Main Topics  . Smoking status: Former Smoker -- 1.5 packs/day    Types: Cigarettes    Quit date: 08/12/2011  . Smokeless tobacco: Never Used  . Alcohol Use: No  . Drug Use: No  . Sexually Active: No   Other Topics Concern  . Not on file   Social History Narrative  . No narrative on file     Allergies  Allergen Reactions  . Sulfa Antibiotics Other (See Comments)    Dizzines, nausea  . Penicillins Rash     Outpatient Prescriptions Prior to Visit  Medication Sig Dispense Refill  . albuterol (PROVENTIL HFA;VENTOLIN HFA) 108 (90 BASE) MCG/ACT inhaler Inhale 2 puffs into the lungs every 6 (six) hours as needed. wheezing      . amLODipine (NORVASC) 5 MG tablet Take 5 mg by mouth daily.      . cetirizine (ZYRTEC) 10 MG tablet Take 10 mg by  mouth daily.      . DULoxetine (CYMBALTA) 30 MG capsule Take 30 mg by mouth daily.      . nicotine (NICODERM CQ - DOSED IN MG/24 HOURS) 14 mg/24hr patch Place 1 patch onto the skin daily.  28 patch  1  . pravastatin (PRAVACHOL) 20 MG tablet Take 20 mg by mouth daily.      . traZODone (DESYREL) 100 MG tablet Take 100-200 mg by mouth at bedtime.      . valsartan-hydrochlorothiazide (DIOVAN-HCT) 320-25 MG per tablet Take 1 tablet by mouth daily.      Marland Kitchen albuterol (PROVENTIL) (5 MG/ML) 0.5% nebulizer solution Take 0.5 mLs (2.5 mg total) by nebulization every 4 (four) hours as needed for wheezing or shortness of breath.  20 mL  1  . ipratropium (ATROVENT) 0.02 % nebulizer solution Take 2.5 mLs (0.5 mg total) by nebulization every 4 (four) hours.  75 mL  1  . methocarbamol (ROBAXIN) 500 MG tablet Take 500 mg by mouth 4 (four) times daily.      Marland Kitchen  pantoprazole (PROTONIX) 40 MG tablet Take 1 tablet (40 mg total) by mouth daily.  30 tablet  0  . predniSONE (DELTASONE) 20 MG tablet PREDNISONE 60MG  DAILY FOR 3 DAYS followed by  Prednisone 40mg  daily for 3 days followed by Prednisone 30mg  daily for 3 days followed by Prednisone 20mg  daily  For 3 days followed by Prednisone 10mg  daily for 3 days.  25 tablet  0      Review of Systems  Constitutional: Positive for activity change and fatigue. Negative for fever, chills, diaphoresis, appetite change and unexpected weight change.  HENT: Positive for dental problem and voice change. Negative for hearing loss, ear pain, nosebleeds, congestion, sore throat, facial swelling, rhinorrhea, sneezing, mouth sores, trouble swallowing, neck pain, neck stiffness, postnasal drip, sinus pressure, tinnitus and ear discharge.   Eyes: Negative for photophobia, discharge, itching and visual disturbance.  Respiratory: Positive for cough and shortness of breath. Negative for apnea, choking, chest tightness, wheezing and stridor.   Cardiovascular: Negative for chest pain,  palpitations and leg swelling.  Gastrointestinal: Positive for abdominal pain. Negative for nausea, vomiting, constipation, blood in stool and abdominal distention.  Genitourinary: Positive for frequency. Negative for dysuria, urgency, hematuria, flank pain, decreased urine volume and difficulty urinating.  Musculoskeletal: Positive for back pain. Negative for myalgias, joint swelling, arthralgias and gait problem.  Skin: Positive for rash. Negative for color change and pallor.  Neurological: Positive for tremors, weakness and numbness. Negative for dizziness, seizures, syncope, speech difficulty, light-headedness and headaches.  Hematological: Negative for adenopathy. Does not bruise/bleed easily.  Psychiatric/Behavioral: Positive for sleep disturbance. Negative for confusion and agitation. The patient is nervous/anxious.        Objective:   Physical Exam Filed Vitals:   08/26/11 1027  BP: 118/66  Pulse: 81  Temp: 98.6 F (37 C)  TempSrc: Oral  Height: 5' 8.5" (1.74 m)  Weight: 265 lb (120.203 kg)  SpO2: 97%    Gen: Pleasant, well-nourished, in no distress,  normal affect  ENT: No lesions,  mouth clear,  oropharynx clear, no postnasal drip  Neck: No JVD, no TMG, no carotid bruits  Lungs: No use of accessory muscles, no dullness to percussion, pseudowheeze  Cardiovascular: RRR, heart sounds normal, no murmur or gallops, no peripheral edema  Abdomen: soft and NT, no HSM,  BS normal  Musculoskeletal: No deformities, no cyanosis or clubbing  Neuro: alert, non focal  Skin: Warm, no lesions or rashes  1/28 Cleda Daub: FeV1 54%  FVC 61%  Fef 25 75 36%          Assessment & Plan:   COPD (chronic obstructive pulmonary disease) Moderate obstructive lung disease with associated acute on chronic bronchitis and PNA that has resolve s/p acute influenza A Plan Stay off smoking  Start Spiriva daily Stop nebulizer except use as needed  Every 4 hours No more antibiotics or  prednisone An overnight sleep oxygen test will be obtained Return 2 months [place]     Updated Medication List Outpatient Encounter Prescriptions as of 08/26/2011  Medication Sig Dispense Refill  . albuterol (PROVENTIL HFA;VENTOLIN HFA) 108 (90 BASE) MCG/ACT inhaler Inhale 2 puffs into the lungs every 6 (six) hours as needed. wheezing      . amLODipine (NORVASC) 5 MG tablet Take 5 mg by mouth daily.      . cetirizine (ZYRTEC) 10 MG tablet Take 10 mg by mouth daily.      . DULoxetine (CYMBALTA) 30 MG capsule Take 30 mg by mouth daily.      Marland Kitchen  Lansoprazole (PREVACID PO) Take by mouth daily. Patient unsure of dosage      . nicotine (NICODERM CQ - DOSED IN MG/24 HOURS) 14 mg/24hr patch Place 1 patch onto the skin daily.  28 patch  1  . pravastatin (PRAVACHOL) 20 MG tablet Take 20 mg by mouth daily.      . traZODone (DESYREL) 100 MG tablet Take 100-200 mg by mouth at bedtime.      . valsartan-hydrochlorothiazide (DIOVAN-HCT) 320-25 MG per tablet Take 1 tablet by mouth daily.      Marland Kitchen albuterol (PROVENTIL) (5 MG/ML) 0.5% nebulizer solution Take 0.5 mLs (2.5 mg total) by nebulization every 4 (four) hours as needed for wheezing or shortness of breath.  20 mL  1  . tiotropium (SPIRIVA HANDIHALER) 18 MCG inhalation capsule Place 1 capsule (18 mcg total) into inhaler and inhale daily.  30 capsule  6  . DISCONTD: ipratropium (ATROVENT) 0.02 % nebulizer solution Take 2.5 mLs (0.5 mg total) by nebulization every 4 (four) hours.  75 mL  1  . DISCONTD: methocarbamol (ROBAXIN) 500 MG tablet Take 500 mg by mouth 4 (four) times daily.      Marland Kitchen DISCONTD: pantoprazole (PROTONIX) 40 MG tablet Take 1 tablet (40 mg total) by mouth daily.  30 tablet  0  . DISCONTD: predniSONE (DELTASONE) 20 MG tablet PREDNISONE 60MG  DAILY FOR 3 DAYS followed by  Prednisone 40mg  daily for 3 days followed by Prednisone 30mg  daily for 3 days followed by Prednisone 20mg  daily  For 3 days followed by Prednisone 10mg  daily for 3 days.  25  tablet  0

## 2011-08-26 NOTE — Assessment & Plan Note (Signed)
Moderate obstructive lung disease with associated acute on chronic bronchitis and PNA that has resolve s/p acute influenza A Plan Stay off smoking  Start Spiriva daily Stop nebulizer except use as needed  Every 4 hours No more antibiotics or prednisone An overnight sleep oxygen test will be obtained Return 2 months [place]

## 2011-08-26 NOTE — Patient Instructions (Addendum)
Stay off smoking  Start Spiriva daily Stop nebulizer except use as needed  Every 4 hours No more antibiotics or prednisone An overnight sleep oxygen test will be obtained Return 2 months [place]

## 2011-09-04 ENCOUNTER — Telehealth: Payer: Self-pay | Admitting: Critical Care Medicine

## 2011-09-04 DIAGNOSIS — J449 Chronic obstructive pulmonary disease, unspecified: Secondary | ICD-10-CM

## 2011-09-04 NOTE — Telephone Encounter (Signed)
02 ordered through [facility] 2l [facility]

## 2011-09-04 NOTE — Telephone Encounter (Signed)
ONO shows desaturation.  This pt will need QHS oxygen 2Liters from [facility]

## 2011-09-05 ENCOUNTER — Telehealth: Payer: Self-pay | Admitting: Critical Care Medicine

## 2011-09-05 NOTE — Telephone Encounter (Signed)
Pt states has not been able to sleep since leaving the hospital, wakes up gasping for air every night, is afraid to go to sleep some nights. Pt is requesting to go back on CPAP again and states she used [facility] in the past. I called [facility] and was informed that pt has only received O2 service from there. Pt has been using husbands CPAP machine. Please advise, thanks.

## 2011-09-05 NOTE — Telephone Encounter (Signed)
Pt scheduled to see CDY 09/06/11 @ 11:15am. Okay per KW, I advised pt to be her at 11am and of PW's recs. Pt verbalized understanding.

## 2011-09-05 NOTE — Telephone Encounter (Signed)
She needs sleep medicine eval with one of the sleep MDs ASAP  Hold oxygen Rx until this is done

## 2011-09-06 ENCOUNTER — Encounter: Payer: Self-pay | Admitting: Internal Medicine

## 2011-09-06 ENCOUNTER — Ambulatory Visit (INDEPENDENT_AMBULATORY_CARE_PROVIDER_SITE_OTHER): Payer: Medicare Other | Admitting: Internal Medicine

## 2011-09-06 VITALS — BP 128/80 | HR 91 | Ht 68.5 in | Wt 281.8 lb

## 2011-09-06 DIAGNOSIS — L538 Other specified erythematous conditions: Secondary | ICD-10-CM

## 2011-09-06 DIAGNOSIS — L304 Erythema intertrigo: Secondary | ICD-10-CM

## 2011-09-06 DIAGNOSIS — G4733 Obstructive sleep apnea (adult) (pediatric): Secondary | ICD-10-CM

## 2011-09-06 NOTE — Patient Instructions (Signed)
Our first step is to get the results of your diagnostic sleep study from [facility]. When we can produce this report for [facility] then we will get them to do an autotitration and provide you your own mask. You might want to try one of the nasal pillows mask for less pressure on your face.   Try one of the otc antifungal skin products for your rash.

## 2011-09-06 NOTE — Progress Notes (Signed)
64 yoF former smoker, followed by Dr Delford Field for COPD w/ hx pneumonia. Has hx OSA and wants to restart CPAP.  NPSG 01/04/03 [place] Severe OSA, RDI.( AHI )32.3/hr. [place] 11/24, weight 270 lbs. Dr. Delford Field had seen her once for diagnoses of COPD. She was hospitalized with the flu syndrome and pneumonia and since then has been waking, gasping at night and short of breath with exertion. The pneumonia cleared by chest x-ray but her dyspnea has not improved. She is in a separate bedroom from her husband, who uses CPAP, so he does not report problems during her sleep. Trazodone is being used as a sleep medication. She has a previous diagnosis of delayed pressure urticaria. CPAP mask and straps were causing this to flare. She dropped off of CPAP several years ago. Has tried oral appliances without being able to see satisfactory improvement. Recently she has tried wearing her husband's CPAP to get an extra hour of sleep in the morning while he is getting up and going for the day. Overnight oximetry demonstrated frequent desaturation. Surgical history includes tonsils, sinus surgery twice by Dr. Haroldine Laws. Bedtime between midnight and 2 AM, estimating 30-60 minutes sleep latency. Wakes 6 or more times per night and gets up between noon and 2 PM. She is not employed. 10 pound weight gain in the last year.  ROS-see HPI Constitutional:   No-   weight loss, night sweats, fevers, chills, fatigue, lassitude. HEENT:   No-  headaches, difficulty swallowing, tooth/dental problems, sore throat,       No-  sneezing, itching, ear ache, nasal congestion, post nasal drip,  CV:  No-   chest pain, orthopnea, PND, swelling in lower extremities, anasarca, dizziness, palpitations Resp: No-   shortness of breath with exertion or at rest.              No-   productive cough,  No non-productive cough,  No- coughing up of blood.              No-   change in color of mucus.  No- wheezing.   Skin: No-   rash or lesions. GI:  No-    heartburn, indigestion, abdominal pain, nausea, vomiting, diarrhea,                 change in bowel habits, loss of appetite GU: MS:  No-   joint pain or swelling.  No- decreased range of motion.  No- back pain. Neuro-     nothing unusual Psych:  No- change in mood or affect. No depression or anxiety.  No memory loss.  OBJ- Physical Exam General- Alert, Oriented, Affect-appropriate, Distress- none acute, obese Skin- rash-none, lesions- none, excoriation- none Lymphadenopathy- none Head- atraumatic            Eyes- Gross vision intact, PERRLA, conjunctivae and secretions clear            Ears- Hearing, canals-normal            Nose- Clear, no-Septal dev, mucus, polyps, erosion, perforation             Throat- Mallampati II-III , mucosa clear , drainage- none, tonsils- atrophic. Mild hoarseness Neck- flexible , trachea midline, no stridor , thyroid nl, carotid no bruit Chest - symmetrical excursion , unlabored           Heart/CV- RRR , no murmur , no gallop  , no rub, nl s1 s2                           -  JVD- none , edema- none, stasis changes- none, varices- none           Lung- clear to P&A but diminished, wheeze- none, cough- none , dullness-none, rub- none           Chest wall- intertrigo Abd- tender-no, distended-no, bowel sounds-present, HSM- no Br/ Gen/ Rectal- Not done, not indicated Extrem- cyanosis- none, clubbing, none, atrophy- none, strength- nl Neuro- resting tremor

## 2011-09-09 ENCOUNTER — Telehealth: Payer: Self-pay | Admitting: Critical Care Medicine

## 2011-09-09 ENCOUNTER — Encounter: Payer: Self-pay | Admitting: Critical Care Medicine

## 2011-09-09 DIAGNOSIS — L304 Erythema intertrigo: Secondary | ICD-10-CM | POA: Insufficient documentation

## 2011-09-09 NOTE — Assessment & Plan Note (Signed)
Discussed management with OTC antifungal preparation. Keep skin dry.

## 2011-09-09 NOTE — Assessment & Plan Note (Signed)
She understands the CPAP and wants to restart. She had not been adequately controlled with surgery/UPPP or with oral appliances in the past. Her husband's CPAP mask has not induced pressure urticaria which had been the original concern. Plan restart CPAP with autotitration.

## 2011-09-09 NOTE — Telephone Encounter (Signed)
Message and sleep study attached and given to CY to address.

## 2011-09-11 ENCOUNTER — Telehealth: Payer: Self-pay | Admitting: Internal Medicine

## 2011-09-11 DIAGNOSIS — G4733 Obstructive sleep apnea (adult) (pediatric): Secondary | ICD-10-CM

## 2011-09-11 NOTE — Telephone Encounter (Signed)
Per CY-let patient know that she will need a new sleep study; if patient agrees then order Split night study; if she doesn't want to have study then she will not be able to get CPAP.    I left message for patient to call and speak with Triage nurse.

## 2011-09-11 NOTE — Telephone Encounter (Signed)
I spoke with pt and she agree'd to have the split night sleep study done. I have placed the order in [facility] and pt is aware she will be contacted by the sleep lab to have this scheduled. Mayra Reel is aware of this

## 2011-09-11 NOTE — Telephone Encounter (Signed)
Pt returned call.  [name] [name] ° °

## 2011-09-11 NOTE — Telephone Encounter (Signed)
I spoke with [name] [name] and she states medicare is not going to pay for pt's cpap bc her last sleep study was in 2004. She states medicare will not cover this unless pt has had a more recent one. Please advise Dr. Maple Hudson thanks

## 2011-09-12 NOTE — Telephone Encounter (Signed)
Katie, do you know if this has been taken care of yet?

## 2011-09-12 NOTE — Telephone Encounter (Signed)
See phone note from 09-11-11.

## 2011-09-26 ENCOUNTER — Ambulatory Visit: Payer: Medicare Other | Admitting: Critical Care Medicine

## 2011-09-27 ENCOUNTER — Ambulatory Visit (HOSPITAL_BASED_OUTPATIENT_CLINIC_OR_DEPARTMENT_OTHER): Payer: Medicare Other | Attending: Internal Medicine

## 2011-09-27 VITALS — Ht 68.0 in | Wt 270.0 lb

## 2011-09-27 DIAGNOSIS — G4733 Obstructive sleep apnea (adult) (pediatric): Secondary | ICD-10-CM

## 2011-10-05 DIAGNOSIS — G4733 Obstructive sleep apnea (adult) (pediatric): Secondary | ICD-10-CM

## 2011-10-05 NOTE — Procedures (Signed)
[name] [name], [name] [name]                 ACCOUNT NO.:  [id number]  MEDICAL RECORD NO.:  [id number]          PATIENT TYPE:  OUT  LOCATION:  SLEEP CENTER                 FACILITY:  [facility]  PHYSICIAN:  Clinton D. Maple Hudson, MD, FCCP, FACPDATE OF BIRTH:  08/12/1953  DATE OF STUDY:  09/27/2011                           NOCTURNAL POLYSOMNOGRAM  REFERRING PHYSICIAN:  Clinton D. Maple Hudson, MD, FCCP, FACP  REFERRING PHYSICIAN:  Clinton D. Young, MD, FCCP, FACP  INDICATION FOR STUDY:  Insomnia with sleep apnea.  EPWORTH SLEEPINESS SCORE:  5/24.  BMI 41.1, weight 270 pounds.  Height 68 inches, neck 16 inches.  HOME MEDICATIONS:  Charted and reviewed.  SLEEP ARCHITECTURE:  Total sleep time 57.5 minutes with sleep efficiency 13.6%.  Stage I was 24.3%, stage II 75.7%, stages III and REM were absent.  Sleep latency 117 minutes. Awake after sleep onset 246 minutes. Arousal index 47.  Bedtime medication:  Zantac, trazodone.  RESPIRATORY DATA:  Apnea/hypopnea index (AHI) 103.3 per hour.  A total of 99 events was scored with a total of 21 obstructive apneas, 1 central apnea, 1 mixed apnea, and 76 hypopneas.  All events were associated with non-supine sleep position and were associated with the limited amount of time that she slept.  There was insufficient sleep to permit split protocol, CPAP titration.  OXYGEN DATA:  Very loud snoring with oxygen desaturation to a nadir of 83% and mean oxygen saturation through the study of 91.5% on room air.  CARDIAC DATA:  Sinus rhythm.  MOVEMENT/PARASOMNIA:  No significant movement disturbance.  Bathroom x3. The patient would only sleep in left and right decubitus positions and would not sleep on her back.  IMPRESSION/RECOMMENDATION: 1. Markedly limited total sleep time despite trazodone.  Need to     clarify if this is typical of home pattern. 2. Very severe obstructive sleep apnea/hypopnea syndrome, AHI 103.3     per hour reflecting very frequent apneic events when  she was      asleep.  Loud snoring with oxygen desaturation to a nadir of 83% and     mean oxygen saturation through the study of 91.5% on room air. 3. Protocol did not allow application of split titration CPAP on the     study night.  Consider return for dedicated CPAP titration or     evaluate for alternative management as clinically appropriate.     Clinton D. Maple Hudson, MD, [facility], FACP Diplomate, American Board of Sleep Medicine    CDY/MEDQ  D:  10/05/2011 10:30:21  T:  10/05/2011 23:43:53  Job:  [id number]

## 2011-10-16 ENCOUNTER — Telehealth: Payer: Self-pay | Admitting: Internal Medicine

## 2011-10-16 DIAGNOSIS — G4733 Obstructive sleep apnea (adult) (pediatric): Secondary | ICD-10-CM

## 2011-10-16 NOTE — Telephone Encounter (Signed)
Called and spoke with pt. Pt is calling for sleep study results that were done 09/27/11.  Pt is aware CY out of office today and will return tomorrow and is ok to wait until tomorrow for a response.  CY, please advise.  Thanks!

## 2011-10-17 NOTE — Telephone Encounter (Signed)
LMTCB

## 2011-10-17 NOTE — Telephone Encounter (Signed)
Sleep study showed severe obstructive sleep apnea. Please order- PCC/DME  New CPAP autotitrate 5-20 cwp x 2 weeks for pressure recommendation, mask of choice, humidifier,  Dx OSA She will need to make and keep an office f/u with me in 6 weeks for Medicare required follow-up.

## 2011-10-17 NOTE — Telephone Encounter (Signed)
Ok - CDY 

## 2011-10-17 NOTE — Telephone Encounter (Signed)
Returning call.

## 2011-10-17 NOTE — Telephone Encounter (Signed)
Dr. Delford Field, please advise if you are ok with this.  Thanks!

## 2011-10-17 NOTE — Telephone Encounter (Signed)
Called and spoke with pt. Informed her of sleep study results and CY's recs to start on cpap.  Pt was ok with this and is aware order has been sent to DME company.  Pt scheduled 6 week f/u appt for 11/28/11 at 3:45 pm.    Pt also wanted to know if she can transfer all her care to just Dr. Maple Hudson.  She states "nothing against Dr. Delford Field but it is just too confusing going between two doctors."  Pt requests to just f/u with Dr. Maple Hudson for both pulm and sleep issues.  Pt is aware we will need to get the approval from both physicians first and is ok with this.   CY and PW, please advise.  Thanks!

## 2011-10-18 NOTE — Telephone Encounter (Signed)
Pt aware and will be seen for FU on 11/28/11 w/ Dr. Maple Hudson.

## 2011-10-18 NOTE — Telephone Encounter (Signed)
This is fine with me    PW

## 2011-10-21 ENCOUNTER — Ambulatory Visit: Payer: Medicare Other | Admitting: Critical Care Medicine

## 2011-10-29 ENCOUNTER — Ambulatory Visit: Payer: Medicare Other | Admitting: Critical Care Medicine

## 2011-11-28 ENCOUNTER — Ambulatory Visit (INDEPENDENT_AMBULATORY_CARE_PROVIDER_SITE_OTHER): Payer: Medicare Other | Admitting: Internal Medicine

## 2011-11-28 ENCOUNTER — Encounter: Payer: Self-pay | Admitting: Internal Medicine

## 2011-11-28 VITALS — BP 146/72 | HR 86 | Ht 68.5 in | Wt 286.4 lb

## 2011-11-28 DIAGNOSIS — G4733 Obstructive sleep apnea (adult) (pediatric): Secondary | ICD-10-CM

## 2011-11-28 DIAGNOSIS — F17200 Nicotine dependence, unspecified, uncomplicated: Secondary | ICD-10-CM

## 2011-11-28 NOTE — Progress Notes (Signed)
83 yoF former smoker, followed by Dr Delford Field for COPD w/ hx pneumonia. Has hx OSA and wants to restart CPAP.  NPSG 01/04/03 [place] Severe OSA, RDI.( AHI )32.3/hr. [place] 11/24, weight 270 lbs. Dr. Delford Field had seen her once for diagnoses of COPD. She was hospitalized with the flu syndrome and pneumonia and since then has been waking, gasping at night and short of breath with exertion. The pneumonia cleared by chest x-ray but her dyspnea has not improved. She is in a separate bedroom from her husband, who uses CPAP, so he does not report problems during her sleep. Trazodone is being used as a sleep medication. She has a previous diagnosis of delayed pressure urticaria. CPAP mask and straps were causing this to flare. She dropped off of CPAP several years ago. Has tried oral appliances without being able to see satisfactory improvement. Recently she has tried wearing her husband's CPAP to get an extra hour of sleep in the morning while he is getting up and going for the day. Overnight oximetry demonstrated frequent desaturation. Surgical history includes tonsils, sinus surgery twice by Dr. Haroldine Laws. Bedtime between midnight and 2 AM, estimating 30-60 minutes sleep latency. Wakes 6 or more times per night and gets up between noon and 2 PM. She is not employed. 10 pound weight gain in the last year.  11/28/11- 20 yoF former smoker, followed by Dr Maple Hudson for OSA and by Dr Delford Field for COPD w/ hx pneumonia. Wears CPAP every night approx 8-10 hours; pressure could be more at times per patient. Sleeping better. She wants help stopping tobacco.  Patches caused skin breakout. Still at 1-1/2 packs per day. Chest x-ray 08/10/2011-stable bronchitis changes.  ROS-see HPI Constitutional:   No-   weight loss, night sweats, fevers, chills, fatigue, lassitude. HEENT:   No-  headaches, difficulty swallowing, tooth/dental problems, sore throat,       No-  sneezing, itching, ear ache, nasal congestion, post nasal drip,  CV:   No-   chest pain, orthopnea, PND, swelling in lower extremities, anasarca, dizziness, palpitations Resp: No-   shortness of breath with exertion or at rest.              No-   productive cough,  No non-productive cough,  No- coughing up of blood.              No-   change in color of mucus.  No- wheezing.   Skin: No-   rash or lesions. GI:  No-   heartburn, indigestion, abdominal pain, nausea, vomiting, diarrhea,                 change in bowel habits, loss of appetite GU: MS:  No-   joint pain or swelling.  No- decreased range of motion.  No- back pain. Neuro-     nothing unusual Psych:  No- change in mood or affect. No depression or anxiety.  No memory loss.  OBJ- Physical Exam General- Alert, Oriented, Affect-appropriate, Distress- none acute, obese Skin- rash-none, lesions- none, excoriation- none Lymphadenopathy- none Head- atraumatic            Eyes- Gross vision intact, PERRLA, conjunctivae and secretions clear            Ears- Hearing, canals-normal            Nose- Clear, no-Septal dev, mucus, polyps, erosion, perforation             Throat- Mallampati II-III , mucosa clear , drainage- none, tonsils-  atrophic. Mild hoarseness Neck- flexible , trachea midline, no stridor , thyroid nl, carotid no bruit Chest - symmetrical excursion , unlabored           Heart/CV- RRR , no murmur , no gallop  , no rub, nl s1 s2                           - JVD- none , edema R>L 1+, stasis changes- none, varices- none           Lung- clear to P&A but diminished, wheeze- none, cough- none , dullness-none, rub- none           Chest wall- intertrigo Abd- tender-no, distended-no, bowel sounds-present, HSM- no Br/ Gen/ Rectal- Not done, not indicated Extrem- cyanosis- none, clubbing, none, atrophy- none, strength- nl Neuro- resting tremor

## 2011-11-28 NOTE — Patient Instructions (Signed)
Order- DME [facility]- Change CPAP to 15 cwp fixed   Information on smoking cessation program at [facility]

## 2011-11-30 NOTE — Assessment & Plan Note (Signed)
Smoking cessation support. Information on [facility] hospital program

## 2011-11-30 NOTE — Assessment & Plan Note (Signed)
Good CPAP using autotitration but would like a little higher pressure. Plan-change to fixed CPAP 15

## 2011-12-25 ENCOUNTER — Encounter: Payer: Self-pay | Admitting: Internal Medicine

## 2012-01-28 ENCOUNTER — Ambulatory Visit (INDEPENDENT_AMBULATORY_CARE_PROVIDER_SITE_OTHER): Payer: Medicare Other | Admitting: Internal Medicine

## 2012-01-28 ENCOUNTER — Encounter: Payer: Self-pay | Admitting: Internal Medicine

## 2012-01-28 ENCOUNTER — Ambulatory Visit (INDEPENDENT_AMBULATORY_CARE_PROVIDER_SITE_OTHER)
Admission: RE | Admit: 2012-01-28 | Discharge: 2012-01-28 | Disposition: A | Discharge status: Home / Self Care | Payer: Medicare Other | Source: Ambulatory Visit | Attending: Internal Medicine | Admitting: Internal Medicine

## 2012-01-28 VITALS — BP 132/82 | HR 98 | Ht 68.5 in | Wt 293.2 lb

## 2012-01-28 DIAGNOSIS — L2089 Other atopic dermatitis: Secondary | ICD-10-CM

## 2012-01-28 DIAGNOSIS — F17200 Nicotine dependence, unspecified, uncomplicated: Secondary | ICD-10-CM

## 2012-01-28 DIAGNOSIS — G4733 Obstructive sleep apnea (adult) (pediatric): Secondary | ICD-10-CM

## 2012-01-28 DIAGNOSIS — J449 Chronic obstructive pulmonary disease, unspecified: Secondary | ICD-10-CM

## 2012-01-28 DIAGNOSIS — Z720 Tobacco use: Secondary | ICD-10-CM

## 2012-01-28 DIAGNOSIS — L209 Atopic dermatitis, unspecified: Secondary | ICD-10-CM

## 2012-01-28 NOTE — Progress Notes (Signed)
64 yoF former smoker, followed by Dr Delford Field for COPD w/ hx pneumonia. Has hx OSA and wants to restart CPAP.  NPSG 01/04/03 [place] Severe OSA, RDI.( AHI )32.3/hr. [place] 11/24, weight 270 lbs. Dr. Delford Field had seen her once for diagnoses of COPD. She was hospitalized with the flu syndrome and pneumonia and since then has been waking, gasping at night and short of breath with exertion. The pneumonia cleared by chest x-ray but her dyspnea has not improved. She is in a separate bedroom from her husband, who uses CPAP, so he does not report problems during her sleep. Trazodone is being used as a sleep medication. She has a previous diagnosis of delayed pressure urticaria. CPAP mask and straps were causing this to flare. She dropped off of CPAP several years ago. Has tried oral appliances without being able to see satisfactory improvement. Recently she has tried wearing her husband's CPAP to get an extra hour of sleep in the morning while he is getting up and going for the day. Overnight oximetry demonstrated frequent desaturation. Surgical history includes tonsils, sinus surgery twice by Dr. Haroldine Laws. Bedtime between midnight and 2 AM, estimating 30-60 minutes sleep latency. Wakes 6 or more times per night and gets up between noon and 2 PM. She is not employed. 10 pound weight gain in the last year.  11/28/11- 42 yoF former smoker, followed by Dr Maple Hudson for OSA and by Dr Delford Field for COPD w/ hx pneumonia. Wears CPAP every night approx 8-10 hours; pressure could be more at times per patient. Sleeping better. She wants help stopping tobacco.  Patches caused skin breakout. Still at 1-1/2 packs per day. Chest x-ray 08/10/2011-stable bronchitis changes.  01/28/12- 77 yoF former smoker, followed by Dr Maple Hudson for OSA and by Dr Delford Field for COPD w/ hx pneumonia. PCP Dr Leonette Most Wears CPAP every night for approximately 8-12 hours; pressure working well- however at times feels like the pressure could be increased. SOB increased  (gotten worse since heat started), denies any wheezing, cough, or congestion Hospitalized in January for pneumonia/flu. She is following with me now for her COPD as well as her sleep apnea, because she didn't want to deal with 2 different doctors. She continues CPAP 15/[facility], using a nasal mask. She describes very good compliance and control but she would like to try a little higher pressure for comfort. Her COPD assessment test (CAT) score 22/40 Denies significant breathing problems in the spring. Some increased shortness of breath now with hot humid weather. She still smokes 1-1/2 packs per day despite counseling efforts. Daily Zyrtec. Now diagnosed with diabetes.  ROS-see HPI Constitutional:   No-   weight loss, night sweats, fevers, chills, fatigue, lassitude. HEENT:   No-  headaches, difficulty swallowing, tooth/dental problems, sore throat,       No-  sneezing, itching, ear ache, nasal congestion, post nasal drip,  CV:  No-   chest pain, orthopnea, PND, swelling in lower extremities, anasarca, dizziness, palpitations Resp: +  shortness of breath with exertion or at rest.              No-   productive cough,  No non-productive cough,  No- coughing up of blood.              No-   change in color of mucus.  No- wheezing.   Skin: No-   rash or lesions. + Generalized itching. She scratches at herself a lot on exposed skin GI:  No-   heartburn, indigestion, abdominal pain,  nausea, vomiting, GU: MS:  No-   joint pain or swelling.  in. Neuro-     nothing unusual Psych:  No- change in mood or affect. No depression or anxiety.  No memory loss.  OBJ- Physical Exam General- Alert, Oriented, Affect-appropriate, Distress- none acute, obese Skin- rash-none, lesions- none, excoriation- none. Scratching at herself. Lichenified, excoriated areas especially on her arms Lymphadenopathy- none Head- atraumatic            Eyes- Gross vision intact, PERRLA, conjunctivae and secretions clear             Ears- Hearing, canals-normal            Nose- Clear, no-Septal dev, mucus, polyps, erosion, perforation             Throat- Mallampati II-III , mucosa clear , drainage- none, tonsils- atrophic. Mild hoarseness Neck- flexible , trachea midline, no stridor , thyroid nl, carotid no bruit Chest - symmetrical excursion , unlabored           Heart/CV- RRR , no murmur , no gallop  , no rub, nl s1 s2                           - JVD- none , edema R>L trace, stasis changes- none, varices- none           Lung- clear to P&A but diminished, wheeze- none, cough- none , dullness-none, rub- none           Chest wall- intertrigo Abd- tender-no, distended-no, Br/ Gen/ Rectal- Not done, not indicated Extrem- cyanosis- none, clubbing, none, atrophy- none, strength- nl Neuro- resting tremor

## 2012-01-28 NOTE — Patient Instructions (Addendum)
Order- DME [facility]- increase CPAP to 17     Please let us know if this is uncomfortable  Order- CXR- DX COPD  Please go to the [facility] Smoking Cessation program. It could make a real difference for you.  Try otc skin lotion - Eucerin or Cetaphil.

## 2012-01-29 NOTE — Progress Notes (Signed)
Quick Note:  LMTCB ______ 

## 2012-01-31 ENCOUNTER — Telehealth: Payer: Self-pay | Admitting: Internal Medicine

## 2012-01-31 ENCOUNTER — Encounter: Payer: Self-pay | Admitting: Internal Medicine

## 2012-01-31 DIAGNOSIS — L209 Atopic dermatitis, unspecified: Secondary | ICD-10-CM | POA: Insufficient documentation

## 2012-01-31 DIAGNOSIS — Z720 Tobacco use: Secondary | ICD-10-CM | POA: Insufficient documentation

## 2012-01-31 NOTE — Telephone Encounter (Signed)
Notes Recorded by Waymon Budge, MD on 01/28/2012 at 5:10 PM CXR- minor scarring. No active disease.  ---  Called, spoke with pt.  I informed her of above cxr results per Dr. Maple Hudson.  She verbalized understanding of this and voiced no further questions/concerns at this time.

## 2012-01-31 NOTE — Assessment & Plan Note (Signed)
This looks like atopic dermatitis with some eczematoid change. She has been scratching at herself to aggravate the problem but I don't see any broken skin. Specific triggers or not clearly defined. She is already taking daily Zyrtec. Plan-try Eucerint or Cetaphil lotion..consider tacrolimus

## 2012-01-31 NOTE — Progress Notes (Signed)
Quick Note:  Spoke with pt. I informed her of cxr results per Dr. Maple Hudson. She verbalized understanding of this. ______

## 2012-01-31 NOTE — Assessment & Plan Note (Signed)
Medical concerns and locally available support measures were reviewed. I have recommended the [facility] smoking cessation program

## 2012-01-31 NOTE — Assessment & Plan Note (Signed)
She has had home oxygen for sleep, along with the CPAP 15. We are increasing CPAP to 17 for trial.

## 2012-01-31 NOTE — Progress Notes (Signed)
Quick Note:  LMTCB ______ 

## 2012-01-31 NOTE — Assessment & Plan Note (Addendum)
Good compliance and control CPAP 15. Weight loss would help. She wants to try little higher pressure so after discussion we will move up to 17.

## 2012-02-04 ENCOUNTER — Ambulatory Visit: Payer: Medicare Other | Admitting: *Deleted

## 2012-04-16 ENCOUNTER — Ambulatory Visit (INDEPENDENT_AMBULATORY_CARE_PROVIDER_SITE_OTHER): Payer: Medicare Other | Admitting: Internal Medicine

## 2012-04-16 ENCOUNTER — Encounter: Payer: Self-pay | Admitting: Internal Medicine

## 2012-04-16 VITALS — BP 160/100 | HR 93 | Ht 68.5 in | Wt 289.4 lb

## 2012-04-16 DIAGNOSIS — G4733 Obstructive sleep apnea (adult) (pediatric): Secondary | ICD-10-CM

## 2012-04-16 DIAGNOSIS — F17200 Nicotine dependence, unspecified, uncomplicated: Secondary | ICD-10-CM

## 2012-04-16 NOTE — Patient Instructions (Addendum)
Consider trying the electronic cigarettes as a step toward weaning off your cigarettes.   Smoking class schedule sheet  Order- Print script- CPAP mask of choice and supplies

## 2012-04-16 NOTE — Progress Notes (Signed)
37 yoF former smoker, followed by Dr Delford Field for COPD w/ hx pneumonia. Has hx OSA and wants to restart CPAP.  NPSG 01/04/03 [place] Severe OSA, RDI.( AHI )32.3/hr. [place] 11/24, weight 270 lbs. Dr. Delford Field had seen her once for diagnoses of COPD. She was hospitalized with the flu syndrome and pneumonia and since then has been waking, gasping at night and short of breath with exertion. The pneumonia cleared by chest x-ray but her dyspnea has not improved. She is in a separate bedroom from her husband, who uses CPAP, so he does not report problems during her sleep. Trazodone is being used as a sleep medication. She has a previous diagnosis of delayed pressure urticaria. CPAP mask and straps were causing this to flare. She dropped off of CPAP several years ago. Has tried oral appliances without being able to see satisfactory improvement. Recently she has tried wearing her husband's CPAP to get an extra hour of sleep in the morning while he is getting up and going for the day. Overnight oximetry demonstrated frequent desaturation. Surgical history includes tonsils, sinus surgery twice by Dr. Haroldine Laws. Bedtime between midnight and 2 AM, estimating 30-60 minutes sleep latency. Wakes 6 or more times per night and gets up between noon and 2 PM. She is not employed. 10 pound weight gain in the last year.  11/28/11- 83 yoF former smoker, followed by Dr Maple Hudson for OSA and by Dr Delford Field for COPD w/ hx pneumonia. Wears CPAP every night approx 8-10 hours; pressure could be more at times per patient. Sleeping better. She wants help stopping tobacco.  Patches caused skin breakout. Still at 1-1/2 packs per day. Chest x-ray 08/10/2011-stable bronchitis changes.  01/28/12- 15 yoF former smoker, followed by Dr Maple Hudson for OSA and by Dr Delford Field for COPD w/ hx pneumonia. PCP Dr Leonette Most Wears CPAP every night for approximately 8-12 hours; pressure working well- however at times feels like the pressure could be increased. SOB increased  (gotten worse since heat started), denies any wheezing, cough, or congestion Hospitalized in January for pneumonia/flu. She is following with me now for her COPD as well as her sleep apnea, because she didn't want to deal with 2 different doctors. She continues CPAP 15/[facility], using a nasal mask. She describes very good compliance and control but she would like to try a little higher pressure for comfort. Her COPD assessment test (CAT) score 22/40 Denies significant breathing problems in the spring. Some increased shortness of breath now with hot humid weather. She still smokes 1-1/2 packs per day despite counseling efforts. Daily Zyrtec. Now diagnosed with diabetes.  04/16/12- 56 yoF  1.5 ppd smoker, followed by Dr Maple Hudson for OSA and by Dr Delford Field for COPD w/ hx pneumonia.  PCP Dr Leonette Most Pt states wears CPAP 17/ Advanced approx 8-12 hours night 7 days a week. Has some issues with mask but will speak with DME company. Likes this pressure. Has had flu vaccine. Primary physician treated prednisone and Z-Pak for an acute bronchitis and she has had chest x-ray CXR 01/31/12 IMPRESSION:  No acute disease.  Original Report Authenticated By: Gwynn Burly, M.D.   ROS-see HPI Constitutional:   No-   weight loss, night sweats, fevers, chills, fatigue, lassitude. HEENT:   No-  headaches, difficulty swallowing, tooth/dental problems, sore throat,       No-  sneezing, itching, ear ache, nasal congestion, post nasal drip,  CV:  No-   chest pain, orthopnea, PND, swelling in lower extremities, anasarca, dizziness, palpitations Resp: +  shortness of breath with exertion or at rest.              No-   productive cough,  No non-productive cough,  No- coughing up of blood.              No-   change in color of mucus.  No- wheezing.   Skin: No-   rash or lesions. + Generalized itching. She scratches at herself a lot on exposed skin GI:  No-   heartburn, indigestion, abdominal pain, nausea, vomiting, GU: MS:   No-   joint pain or swelling.  Neuro-     nothing unusual Psych:  No- change in mood or affect. No depression or anxiety.  No memory loss.  OBJ- Physical Exam General- Alert, Oriented, Affect-appropriate, Distress- none acute, obese Skin- rash-none, lesions- none, excoriation- none. Scratching at herself. Lichenified, excoriated areas especially on her arms Lymphadenopathy- none Head- atraumatic            Eyes- Gross vision intact, PERRLA, conjunctivae and secretions clear            Ears- Hearing, canals-normal            Nose- Clear, no-Septal dev, mucus, polyps, erosion, perforation             Throat- Mallampati II-III , mucosa clear , drainage- none, tonsils- atrophic. Mild hoarseness Neck- flexible , trachea midline, no stridor , thyroid nl, carotid no bruit Chest - symmetrical excursion , unlabored           Heart/CV- RRR , no murmur , no gallop  , no rub, nl s1 s2                           - JVD- none , edema R>L trace, stasis changes- none, varices- none           Lung- + diminished with a few crackles, wheeze- none, cough- none , dullness-none, rub- none           Chest wall- intertrigo Abd-  Br/ Gen/ Rectal- Not done, not indicated Extrem- cyanosis- none, clubbing, none, atrophy- none, strength- nl Neuro- resting tremor

## 2012-04-24 ENCOUNTER — Encounter (HOSPITAL_COMMUNITY): Payer: Self-pay | Admitting: Emergency Medicine

## 2012-04-24 ENCOUNTER — Emergency Department (HOSPITAL_COMMUNITY)
Admission: EM | Admit: 2012-04-24 | Discharge: 2012-04-24 | Disposition: A | Discharge status: Home / Self Care | Payer: Medicare Other | Attending: Emergency Medicine | Admitting: Emergency Medicine

## 2012-04-24 ENCOUNTER — Emergency Department (HOSPITAL_COMMUNITY): Payer: Medicare Other

## 2012-04-24 DIAGNOSIS — S2232XA Fracture of one rib, left side, initial encounter for closed fracture: Secondary | ICD-10-CM

## 2012-04-24 DIAGNOSIS — X58XXXA Exposure to other specified factors, initial encounter: Secondary | ICD-10-CM | POA: Insufficient documentation

## 2012-04-24 DIAGNOSIS — R05 Cough: Secondary | ICD-10-CM

## 2012-04-24 DIAGNOSIS — J4489 Other specified chronic obstructive pulmonary disease: Secondary | ICD-10-CM | POA: Insufficient documentation

## 2012-04-24 DIAGNOSIS — R059 Cough, unspecified: Secondary | ICD-10-CM

## 2012-04-24 DIAGNOSIS — J449 Chronic obstructive pulmonary disease, unspecified: Secondary | ICD-10-CM

## 2012-04-24 DIAGNOSIS — R7309 Other abnormal glucose: Secondary | ICD-10-CM | POA: Insufficient documentation

## 2012-04-24 DIAGNOSIS — R739 Hyperglycemia, unspecified: Secondary | ICD-10-CM

## 2012-04-24 DIAGNOSIS — E785 Hyperlipidemia, unspecified: Secondary | ICD-10-CM | POA: Insufficient documentation

## 2012-04-24 DIAGNOSIS — S2239XA Fracture of one rib, unspecified side, initial encounter for closed fracture: Secondary | ICD-10-CM | POA: Insufficient documentation

## 2012-04-24 DIAGNOSIS — I10 Essential (primary) hypertension: Secondary | ICD-10-CM | POA: Insufficient documentation

## 2012-04-24 DIAGNOSIS — G4730 Sleep apnea, unspecified: Secondary | ICD-10-CM | POA: Insufficient documentation

## 2012-04-24 DIAGNOSIS — F17200 Nicotine dependence, unspecified, uncomplicated: Secondary | ICD-10-CM | POA: Insufficient documentation

## 2012-04-24 LAB — POCT I-STAT, CHEM 8
BUN: 12 mg/dL (ref 6–23)
Chloride: 102 mEq/L (ref 96–112)
Creatinine, Ser: 1 mg/dL (ref 0.50–1.10)
Hemoglobin: 13.6 g/dL (ref 12.0–15.0)
Potassium: 3.4 mEq/L — ABNORMAL LOW (ref 3.5–5.1)
Sodium: 138 mEq/L (ref 135–145)

## 2012-04-24 LAB — CBC WITH DIFFERENTIAL/PLATELET
Basophils Absolute: 0 10*3/uL (ref 0.0–0.1)
Basophils Relative: 0 % (ref 0–1)
Eosinophils Absolute: 0.3 10*3/uL (ref 0.0–0.7)
Eosinophils Relative: 2 % (ref 0–5)
HCT: 38.9 % (ref 36.0–46.0)
MCH: 28.7 pg (ref 26.0–34.0)
MCHC: 34.2 g/dL (ref 30.0–36.0)
MCV: 84 fL (ref 78.0–100.0)
Monocytes Absolute: 0.6 10*3/uL (ref 0.1–1.0)
Monocytes Relative: 5 % (ref 3–12)
Neutro Abs: 8.1 10*3/uL — ABNORMAL HIGH (ref 1.7–7.7)
Neutrophils Relative %: 71 % (ref 43–77)
RBC: 4.63 MIL/uL (ref 3.87–5.11)
RDW: 13.7 % (ref 11.5–15.5)

## 2012-04-24 LAB — GLUCOSE, CAPILLARY

## 2012-04-24 MED ORDER — SODIUM CHLORIDE 0.9 % IV BOLUS (SEPSIS)
1000.0000 mL | Freq: Once | INTRAVENOUS | Status: AC
  Administered 2012-04-24: 1000 mL via INTRAVENOUS

## 2012-04-24 MED ORDER — GUAIFENESIN 100 MG/5ML PO LIQD: 100.0000 mg | ORAL | Status: DC | PRN

## 2012-04-24 MED ORDER — CYCLOBENZAPRINE HCL 10 MG PO TABS: 10.0000 mg | ORAL_TABLET | Freq: Two times a day (BID) | ORAL | Status: DC | PRN

## 2012-04-24 MED ORDER — HYDROMORPHONE HCL PF 1 MG/ML IJ SOLN
0.5000 mg | Freq: Once | INTRAMUSCULAR | Status: AC
  Administered 2012-04-24: 0.5 mg via INTRAVENOUS
  Filled 2012-04-24: qty 1

## 2012-04-24 MED ORDER — OXYCODONE-ACETAMINOPHEN 5-325 MG PO TABS: 1.0000 | ORAL_TABLET | ORAL | Status: DC | PRN

## 2012-04-24 MED ORDER — HYDROCOD POLST-CHLORPHEN POLST 10-8 MG/5ML PO LQCR
10.0000 mL | Freq: Once | ORAL | Status: AC
  Administered 2012-04-24: 10 mL via ORAL
  Filled 2012-04-24: qty 10

## 2012-04-24 NOTE — ED Notes (Signed)
Left rib pain, started about a week ago-- coughed hard this morning felt a pop and heard a "thud" sound in left rib cage. Being treated for bronchitis by Dr. Leonette Most. A/Ox3, w/d,

## 2012-04-24 NOTE — ED Notes (Addendum)
PA at bedside.

## 2012-04-24 NOTE — ED Provider Notes (Signed)
History     CSN: [id number]  Arrival date & time 04/24/12  0711   First MD Initiated Contact with Patient 04/24/12 0730      Chief Complaint  Patient presents with  . left side pain     (Consider location/radiation/quality/duration/timing/severity/associated sxs/prior treatment) HPI Comments: [name] [name] is a 56 y.o. Female who presents with complaint of left sided rib pain, cough, SOB. States has hx of COPD, has had increased cough over several weeks. She is being treated by her doctor for bronchitis, just finished course of prednisone, a z pack, using neb machine at home with no improvement. States around 1-2 am this morning, she coughed, and felt sharp pain in left ribs, with a "thud" sound. States felt something move. States pain increased since then. No medications taken prior to the arrival. States pain with movement and coughing now.    Past Medical History  Diagnosis Date  . Restrictive lung disease   . Sleep apnea   . Hypertension   . Depression   . Anxiety   . COPD (chronic obstructive pulmonary disease)   . Hyperlipidemia     Past Surgical History  Procedure Date  . Abdominal hysterectomy   . Cholecystectomy   . Nasal sinus surgery   . Tonsillectomy     Family History  Problem Relation Age of Onset  . Lung cancer Father   . COPD Father   . Heart attack Mother   . Hypertension Mother   . Hypertension Father   . Heart attack Father     History  Substance Use Topics  . Smoking status: Current Every Day Smoker -- 1.5 packs/day for 46 years    Types: Cigarettes    Last Attempt to Quit: 08/12/2011  . Smokeless tobacco: Never Used  . Alcohol Use: No    OB History    Grav Para Term Preterm Abortions TAB SAB Ect Mult Living                  Review of Systems  Constitutional: Negative for fever, chills and diaphoresis.  HENT: Negative for neck pain and neck stiffness.   Respiratory: Positive for cough, chest tightness, shortness of breath and  wheezing.   Cardiovascular: Positive for chest pain. Negative for palpitations and leg swelling.  Gastrointestinal: Negative for nausea, vomiting and abdominal pain.  Genitourinary: Negative for dysuria and flank pain.  Musculoskeletal: Negative for back pain.  Skin: Negative.   Neurological: Negative for dizziness, weakness and headaches.  Hematological: Negative.     Allergies  Nicotine; Sulfa antibiotics; and Penicillins  Home Medications   Current Outpatient Rx  Name Route Sig Dispense Refill  . ALBUTEROL SULFATE HFA 108 (90 BASE) MCG/ACT IN AERS Inhalation Inhale 2 puffs into the lungs every 6 (six) hours as needed. wheezing    . ALBUTEROL SULFATE (5 MG/ML) 0.5% IN NEBU Nebulization Take 0.5 mLs (2.5 mg total) by nebulization every 4 (four) hours as needed for wheezing or shortness of breath. 20 mL 1  . AMLODIPINE BESYLATE 5 MG PO TABS Oral Take 5 mg by mouth daily.    . AZITHROMYCIN 250 MG PO TABS  Take as directed    . BENZONATATE 100 MG PO CAPS Oral Take 100 mg by mouth 3 (three) times daily as needed.    Marland Kitchen CETIRIZINE HCL 10 MG PO TABS Oral Take 10 mg by mouth daily.    . DULOXETINE HCL 30 MG PO CPEP Oral Take 30 mg by mouth daily.    Marland Kitchen  OXYBUTYNIN CHLORIDE ER 5 MG PO TB24 Oral Take 5 mg by mouth daily.    Marland Kitchen PRAVASTATIN SODIUM 20 MG PO TABS Oral Take 20 mg by mouth daily.    Marland Kitchen PREDNISONE (PAK) 10 MG PO TABS  Taper as directed    . TIOTROPIUM BROMIDE MONOHYDRATE 18 MCG IN CAPS Inhalation Place 1 capsule (18 mcg total) into inhaler and inhale daily. 30 capsule 6  . TRAZODONE HCL 100 MG PO TABS Oral Take 100-200 mg by mouth at bedtime.    Marland Kitchen VALSARTAN-HYDROCHLOROTHIAZIDE 320-25 MG PO TABS Oral Take 1 tablet by mouth daily.      BP 182/77  Pulse 79  Temp 99.3 F (37.4 C) (Oral)  Resp 20  Ht 5' 8.5" (1.74 m)  Wt 290 lb (131.543 kg)  BMI 43.45 kg/m2  SpO2 98%  Physical Exam  Nursing note and vitals reviewed. Constitutional: She is oriented to person, place, and time. She  appears well-developed and well-nourished. No distress.  HENT:  Head: Normocephalic.  Eyes: Conjunctivae normal are normal.  Neck: Neck supple.  Cardiovascular: Normal rate, regular rhythm and normal heart sounds.   Pulmonary/Chest: Effort normal and breath sounds normal. No respiratory distress. She has no wheezes. She has no rales.       Tender over left midaxillary line over lower ribs. No bruising, swelling. No crepitus. No deformity. Normal chest movement  Abdominal: Soft. Bowel sounds are normal. She exhibits no distension. There is no tenderness. There is no rebound.  Musculoskeletal: She exhibits no edema.  Neurological: She is alert and oriented to person, place, and time.  Skin: Skin is warm.  Psychiatric: She has a normal mood and affect.    ED Course  Procedures (including critical care time)  VS normal, pt with sudden onset of left sided chest pain/rib pain while coughing. Will get labs and cxr.   Results for orders placed during the hospital encounter of 04/24/12  CBC WITH DIFFERENTIAL      Component Value Range   WBC 11.5 (*) 4.0 - 10.5 K/uL   RBC 4.63  3.87 - 5.11 MIL/uL   Hemoglobin 13.3  12.0 - 15.0 g/dL   HCT 16.1  09.6 - 04.5 %   MCV 84.0  78.0 - 100.0 fL   MCH 28.7  26.0 - 34.0 pg   MCHC 34.2  30.0 - 36.0 g/dL   RDW 40.9  81.1 - 91.4 %   Platelets 208  150 - 400 K/uL   Neutrophils Relative 71  43 - 77 %   Neutro Abs 8.1 (*) 1.7 - 7.7 K/uL   Lymphocytes Relative 21  12 - 46 %   Lymphs Abs 2.4  0.7 - 4.0 K/uL   Monocytes Relative 5  3 - 12 %   Monocytes Absolute 0.6  0.1 - 1.0 K/uL   Eosinophils Relative 2  0 - 5 %   Eosinophils Absolute 0.3  0.0 - 0.7 K/uL   Basophils Relative 0  0 - 1 %   Basophils Absolute 0.0  0.0 - 0.1 K/uL  POCT I-STAT, CHEM 8      Component Value Range   Sodium 138  135 - 145 mEq/L   Potassium 3.4 (*) 3.5 - 5.1 mEq/L   Chloride 102  96 - 112 mEq/L   BUN 12  6 - 23 mg/dL   Creatinine, Ser 7.82  0.50 - 1.10 mg/dL   Glucose, Bld  956 (*) 70 - 99 mg/dL   Calcium, Ion 2.13  1.12 - 1.23 mmol/L   TCO2 25  0 - 100 mmol/L   Hemoglobin 13.6  12.0 - 15.0 g/dL   HCT 40.9  81.1 - 91.4 %  GLUCOSE, CAPILLARY      Component Value Range   Glucose-Capillary 299 (*) 70 - 99 mg/dL   Comment 1 Documented in Chart     Comment 2 Notify RN     Dg Ribs Unilateral W/chest Left  04/24/2012  *RADIOLOGY REPORT*  Clinical Data: History of bronchitis and coughing.  History of sharp pain in left lateral side.  No history of injury.  History of COPD and hypertension.  History of smoking in the past.  LEFT RIBS AND CHEST - 3+ VIEW  Comparison: 01/28/2012.  Findings: Cardiac silhouette is borderline in size.  There is ectasia of thoracic aorta.  Hilar and mediastinal contours appear stable.  Right lung is free of infiltrates.  Minimal atelectasis is present in left base.  No consolidation, pleural effusion, or pneumothorax is evident.  BB marker marks site of pain in the lower left rib area laterally.  There is a slightly displaced fracture of the anterolateral aspect of the left eighth rib.  No other rib abnormalities are evident.  IMPRESSION: Slightly displaced fracture of the anterolateral aspect of the left eighth rib. No other rib fractures evident.  There is minimal left basilar atelectasis.  No pleural effusion or pneumothorax is evident.  Cardiac silhouette is borderline size.   Original Report Authenticated By: Crawford Givens, M.D.      Date: 04/24/2012  Rate: 65  Rhythm: normal sinus rhythm and premature atrial contractions (PAC)  QRS Axis: normal  Intervals: normal  ST/T Wave abnormalities: nonspecific T wave changes  Conduction Disutrbances:none  Narrative Interpretation:   Old EKG Reviewed: unchanged  Pt treated with pain medications and cough medications in ED, fluids given for hyperglycemia. Rib fracture noted. D/c home with pain meds follow up, pt states her cough is actaully improving. Vs normal.  Filed Vitals:   04/24/12 1110  BP:  163/79  Pulse: 80  Temp:   Resp: 16   .   1. Fracture of rib of left side   2. Cough   3. COPD (chronic obstructive pulmonary disease)   4. Hyperglycemia       MDM  Pt with left rib pain from coughing. CXR showed left rib fracture, no underlaying lung abnormality. VS normal. Not tachycardic or tachypnec, not hypoxic, doubt pe. D/c home in stable condition with pain medications and close follow up.         Lottie Mussel, PA 04/24/12 1722

## 2012-04-24 NOTE — ED Notes (Signed)
Patient transported to X-ray 

## 2012-04-25 NOTE — ED Provider Notes (Signed)
Medical screening examination/treatment/procedure(s) were performed by non-physician practitioner and as supervising physician I was immediately available for consultation/collaboration.  Tobin Chad, MD 04/25/12 [phone]

## 2012-04-25 NOTE — Assessment & Plan Note (Signed)
Counseling efforts were reinforced with discussion of some popular support techniques but she is not really interested.

## 2012-04-25 NOTE — Assessment & Plan Note (Signed)
Comfortable at a pressure 17 with good compliance and control

## 2012-10-15 ENCOUNTER — Ambulatory Visit (INDEPENDENT_AMBULATORY_CARE_PROVIDER_SITE_OTHER): Payer: Medicare Other | Admitting: Internal Medicine

## 2012-10-15 ENCOUNTER — Encounter: Payer: Self-pay | Admitting: Internal Medicine

## 2012-10-15 VITALS — BP 120/76 | HR 67 | Ht 68.5 in | Wt 290.4 lb

## 2012-10-15 DIAGNOSIS — J984 Other disorders of lung: Secondary | ICD-10-CM

## 2012-10-15 DIAGNOSIS — F17200 Nicotine dependence, unspecified, uncomplicated: Secondary | ICD-10-CM

## 2012-10-15 DIAGNOSIS — J449 Chronic obstructive pulmonary disease, unspecified: Secondary | ICD-10-CM

## 2012-10-15 DIAGNOSIS — Z720 Tobacco use: Secondary | ICD-10-CM

## 2012-10-15 DIAGNOSIS — G4733 Obstructive sleep apnea (adult) (pediatric): Secondary | ICD-10-CM

## 2012-10-15 NOTE — Patient Instructions (Addendum)
Ask your pharmacist about alternatives to Biotene for dry mouth. Different drug store chains might have different products. Carrying something in your mouth like a sugar-less candy might help while awake. You can also try using the CPAP humidifier again.   Order- Ambulatory room air oximetry today  Walk more- smoke less !!

## 2012-10-15 NOTE — Progress Notes (Signed)
22 yoF former smoker, followed by Dr Joya Gaskins for COPD w/ hx pneumonia. Has hx OSA and wants to restart CPAP.  NPSG 01/04/03 [place] Severe OSA, RDI.( AHI )32.3/hr. [place] 11/24, weight 270 lbs. Dr. Joya Gaskins had seen her once for diagnoses of COPD. She was hospitalized with the flu syndrome and pneumonia and since then has been waking, gasping at night and short of breath with exertion. The pneumonia cleared by chest x-ray but her dyspnea has not improved. She is in a separate bedroom from her husband, who uses CPAP, so he does not report problems during her sleep. Trazodone is being used as a sleep medication. She has a previous diagnosis of delayed pressure urticaria. CPAP mask and straps were causing this to flare. She dropped off of CPAP several years ago. Has tried oral appliances without being able to see satisfactory improvement. Recently she has tried wearing her husband's CPAP to get an extra hour of sleep in the morning while he is getting up and going for the day. Overnight oximetry demonstrated frequent desaturation. Surgical history includes tonsils, sinus surgery twice by Dr. Ernesto Rutherford. Bedtime between midnight and 2 AM, estimating 30-60 minutes sleep latency. Wakes 6 or more times per night and gets up between noon and 2 PM. She is not employed. 10 pound weight gain in the last year.  11/28/11- 54 yoF former smoker, followed by Dr Annamaria Boots for OSA and by Dr Joya Gaskins for COPD w/ hx pneumonia. Wears CPAP every night approx 8-10 hours; pressure could be more at times per patient. Sleeping better. She wants help stopping tobacco.  Patches caused skin breakout. Still at 1-1/2 packs per day. Chest x-ray 08/10/2011-stable bronchitis changes.  01/28/12- 89 yoF former smoker, followed by Dr Annamaria Boots for OSA and by Dr Joya Gaskins for COPD w/ hx pneumonia. PCP Dr Jeralene Huff Wears CPAP every night for approximately 8-12 hours; pressure working well- however at times feels like the pressure could be increased. SOB increased  (gotten worse since heat started), denies any wheezing, cough, or congestion Hospitalized in January for pneumonia/flu. She is following with me now for her COPD as well as her sleep apnea, because she didn't want to deal with 2 different doctors. She continues CPAP 15/Advanced, using a nasal mask. She describes very good compliance and control but she would like to try a little higher pressure for comfort. Her COPD assessment test (CAT) score 22/40 Denies significant breathing problems in the spring. Some increased shortness of breath now with hot humid weather. She still smokes 1-1/2 packs per day despite counseling efforts. Daily Zyrtec. Now diagnosed with diabetes.  04/16/12- 61 yoF  1.5 ppd smoker, followed by Dr Annamaria Boots for OSA and by Dr Joya Gaskins for COPD w/ hx pneumonia.   PCP Dr Jeralene Huff Pt states wears CPAP 17/ Advanced approx 8-12 hours night 7 days a week. Has some issues with mask but will speak with DME company. Likes this pressure. Has had flu vaccine. Primary physician treated prednisone and Z-Pak for an acute bronchitis and she has had chest x-ray CXR 01/31/12 IMPRESSION:  No acute disease.  Original Report Authenticated By: Larey Seat, M.D.   [birth year]- 57 yoF  1.5 ppd smoker, followed for  OSA and COPD w/ hx pneumonia. FOLLOWS FOR: wears CPAP 17/ Advanced every night for about 10 hours; pressure working well for patient.; Pt would like to have her O2 checked while walking-feels like she gives out of air.  No effort to stop smoking. Dry cough and dry mouth despite  Biotene. Says she is doing "fine" on CPAP. PFT 08/26/2011-restriction. FVC 68%, FEV1 62%, FEV1/FVC 0.93. CXR 04/24/12 IMPRESSION:  Slightly displaced fracture of the anterolateral aspect of the left  eighth rib. No other rib fractures evident. There is minimal left  basilar atelectasis. No pleural effusion or pneumothorax is  evident. Cardiac silhouette is borderline size.  Original Report Authenticated By: Crawford Givens, M.D.  ROS-see HPI Constitutional:   No-   weight loss, night sweats, fevers, chills, fatigue, lassitude. HEENT:   No-  headaches, difficulty swallowing, tooth/dental problems, sore throat,       No-  sneezing, itching, ear ache, nasal congestion, post nasal drip,  CV:  No-   chest pain, orthopnea, PND, swelling in lower extremities, anasarca, dizziness, palpitations Resp: +  shortness of breath with exertion or at rest.              No-   productive cough,  +non-productive cough,  No- coughing up of blood.              No-   change in color of mucus.  No- wheezing.   Skin: No-   rash or lesions. + Generalized itching. She scratches at herself a lot on exposed skin GI:  No-   heartburn, indigestion, abdominal pain, nausea, vomiting, GU: MS:  No-   joint pain or swelling.  Neuro-     nothing unusual Psych:  No- change in mood or affect. No depression or anxiety.  No memory loss.  OBJ- Physical Exam General- Alert, Oriented, Affect-appropriate, Distress- none acute, obese Skin- rash-none, lesions- none, excoriation- none. Scratching at herself. Lichenified, excoriated areas especially on her arms Lymphadenopathy- none Head- atraumatic            Eyes- Gross vision intact, PERRLA, conjunctivae and secretions clear, strabismus            Ears- Hearing, canals-normal            Nose- Clear, no-Septal dev, mucus, polyps, erosion, perforation             Throat- Mallampati II-III , mucosa clear , drainage- none, tonsils- atrophic. Mild hoarseness Neck- flexible , trachea midline, no stridor , thyroid nl, carotid no bruit Chest - symmetrical excursion , unlabored           Heart/CV- RRR , no murmur , no gallop  , no rub, nl s1 s2                           - JVD- none , edema R>L trace, stasis changes- none, varices- none           Lung- + diminished with a few crackles, wheeze- none, cough+light , dullness-none, rub- none           Chest wall- intertrigo Abd-  Br/ Gen/ Rectal- Not done,  not indicated Extrem- cyanosis- none, clubbing, none, atrophy- none, strength- nl Neuro- resting tremor/ head bob

## 2012-10-19 NOTE — Assessment & Plan Note (Signed)
Reinforced importance of smoking cessation and basic measures she can begin for herself.

## 2012-10-19 NOTE — Assessment & Plan Note (Signed)
Claims to be comfortable and functioning well with CPAP 17/Advanced. Good compliance and control

## 2013-02-23 ENCOUNTER — Ambulatory Visit (INDEPENDENT_AMBULATORY_CARE_PROVIDER_SITE_OTHER)
Admission: RE | Admit: 2013-02-23 | Discharge: 2013-02-23 | Disposition: A | Discharge status: Home / Self Care | Payer: Medicare Other | Source: Ambulatory Visit | Attending: Internal Medicine | Admitting: Internal Medicine

## 2013-02-23 ENCOUNTER — Ambulatory Visit (INDEPENDENT_AMBULATORY_CARE_PROVIDER_SITE_OTHER): Payer: Medicare Other | Admitting: Internal Medicine

## 2013-02-23 ENCOUNTER — Encounter: Payer: Self-pay | Admitting: Internal Medicine

## 2013-02-23 VITALS — BP 130/68 | HR 84 | Ht 68.5 in | Wt 287.6 lb

## 2013-02-23 DIAGNOSIS — Z720 Tobacco use: Secondary | ICD-10-CM

## 2013-02-23 DIAGNOSIS — J449 Chronic obstructive pulmonary disease, unspecified: Secondary | ICD-10-CM

## 2013-02-23 DIAGNOSIS — F17200 Nicotine dependence, unspecified, uncomplicated: Secondary | ICD-10-CM

## 2013-02-23 DIAGNOSIS — G4733 Obstructive sleep apnea (adult) (pediatric): Secondary | ICD-10-CM

## 2013-02-23 MED ORDER — OXYBUTYNIN CHLORIDE ER 5 MG PO TB24: 5.0000 mg | ORAL_TABLET | Freq: Every day | ORAL | Status: DC

## 2013-02-23 NOTE — Patient Instructions (Addendum)
Order- CXR   Dx tobacco user  Order- DME [facility]- add humidifier to her CPAP with instruction  Ask your pharmacist about other treatments for dry mouth  Please really think about stopping smoking. We want you to feel well for a long time to come.

## 2013-02-23 NOTE — Progress Notes (Signed)
22 yoF former smoker, followed by Dr Joya Gaskins for COPD w/ hx pneumonia. Has hx OSA and wants to restart CPAP.  NPSG 01/04/03 [place] Severe OSA, RDI.( AHI )32.3/hr. [place] 11/24, weight 270 lbs. Dr. Joya Gaskins had seen her once for diagnoses of COPD. She was hospitalized with the flu syndrome and pneumonia and since then has been waking, gasping at night and short of breath with exertion. The pneumonia cleared by chest x-ray but her dyspnea has not improved. She is in a separate bedroom from her husband, who uses CPAP, so he does not report problems during her sleep. Trazodone is being used as a sleep medication. She has a previous diagnosis of delayed pressure urticaria. CPAP mask and straps were causing this to flare. She dropped off of CPAP several years ago. Has tried oral appliances without being able to see satisfactory improvement. Recently she has tried wearing her husband's CPAP to get an extra hour of sleep in the morning while he is getting up and going for the day. Overnight oximetry demonstrated frequent desaturation. Surgical history includes tonsils, sinus surgery twice by Dr. Ernesto Rutherford. Bedtime between midnight and 2 AM, estimating 30-60 minutes sleep latency. Wakes 6 or more times per night and gets up between noon and 2 PM. She is not employed. 10 pound weight gain in the last year.  11/28/11- 54 yoF former smoker, followed by Dr Annamaria Boots for OSA and by Dr Joya Gaskins for COPD w/ hx pneumonia. Wears CPAP every night approx 8-10 hours; pressure could be more at times per patient. Sleeping better. She wants help stopping tobacco.  Patches caused skin breakout. Still at 1-1/2 packs per day. Chest x-ray 08/10/2011-stable bronchitis changes.  01/28/12- 89 yoF former smoker, followed by Dr Annamaria Boots for OSA and by Dr Joya Gaskins for COPD w/ hx pneumonia. PCP Dr Jeralene Huff Wears CPAP every night for approximately 8-12 hours; pressure working well- however at times feels like the pressure could be increased. SOB increased  (gotten worse since heat started), denies any wheezing, cough, or congestion Hospitalized in January for pneumonia/flu. She is following with me now for her COPD as well as her sleep apnea, because she didn't want to deal with 2 different doctors. She continues CPAP 15/Advanced, using a nasal mask. She describes very good compliance and control but she would like to try a little higher pressure for comfort. Her COPD assessment test (CAT) score 22/40 Denies significant breathing problems in the spring. Some increased shortness of breath now with hot humid weather. She still smokes 1-1/2 packs per day despite counseling efforts. Daily Zyrtec. Now diagnosed with diabetes.  04/16/12- 61 yoF  1.5 ppd smoker, followed by Dr Annamaria Boots for OSA and by Dr Joya Gaskins for COPD w/ hx pneumonia.   PCP Dr Jeralene Huff Pt states wears CPAP 17/ Advanced approx 8-12 hours night 7 days a week. Has some issues with mask but will speak with DME company. Likes this pressure. Has had flu vaccine. Primary physician treated prednisone and Z-Pak for an acute bronchitis and she has had chest x-ray CXR 01/31/12 IMPRESSION:  No acute disease.  Original Report Authenticated By: Larey Seat, M.D.   [birth year]- 57 yoF  1.5 ppd smoker, followed for  OSA and COPD w/ hx pneumonia. FOLLOWS FOR: wears CPAP 17/ Advanced every night for about 10 hours; pressure working well for patient.; Pt would like to have her O2 checked while walking-feels like she gives out of air.  No effort to stop smoking. Dry cough and dry mouth despite  Biotene. Says she is doing "fine" on CPAP. PFT 08/26/2011-restriction. FVC 68%, FEV1 62%, FEV1/FVC 0.93. CXR 04/24/12 IMPRESSION:  Slightly displaced fracture of the anterolateral aspect of the left  eighth rib. No other rib fractures evident. There is minimal left  basilar atelectasis. No pleural effusion or pneumothorax is  evident. Cardiac silhouette is borderline size.  Original Report Authenticated By: Crawford Givens, M.D.  02/23/13- 57 yoF  1.5 ppd smoker, followed for  OSA and COPD w/ hx pneumonia. FOLLOWS FOR: pt reports wearing CPAP 17/ Advanced everynight x12 hrs per night, tolerating pressure settings ok-- no supplies needed at this time Still smoking-we discussed this again.. Dry mouth, never used humidifier, but recognizes effect of Ditropan. Says she had a CT scan by her PCP "nodules" we don't have that report in our system and she will try to get it for [place].  ROS-see HPI Constitutional:   No-   weight loss, night sweats, fevers, chills, fatigue, lassitude. HEENT:   No-  headaches, difficulty swallowing, tooth/dental problems, sore throat,       No-  sneezing, itching, ear ache, nasal congestion, post nasal drip,  CV:  No-   chest pain, orthopnea, PND, swelling in lower extremities, anasarca, dizziness, palpitations Resp: +  shortness of breath with exertion or at rest.              No-   productive cough,  +non-productive cough,  No- coughing up of blood.              No-   change in color of mucus.  No- wheezing.   Skin: No-   rash or lesions. + Generalized itching. She scratches at herself a lot on exposed skin GI:  No-   heartburn, indigestion, abdominal pain, nausea, vomiting, GU: MS:  No-   joint pain or swelling.  Neuro-     nothing unusual Psych:  No- change in mood or affect. No depression or anxiety.  No memory loss.  OBJ- Physical Exam General- Alert, Oriented, Affect-appropriate, Distress- none acute, obese Skin- rash-none, lesions- none, excoriation- none. Scratching at herself. Lichenified, excoriated areas especially on her arms Lymphadenopathy- none Head- atraumatic            Eyes- Gross vision intact, PERRLA, conjunctivae and secretions clear, strabismus            Ears- Hearing, canals-normal            Nose- Clear, no-Septal dev, mucus, polyps, erosion, perforation             Throat- Mallampati II-III , mucosa clear- not very dry , drainage- none, tonsils-  atrophic.+ Mild                             hoarseness Neck- flexible , trachea midline, no stridor , thyroid nl, carotid no bruit Chest - symmetrical excursion , unlabored           Heart/CV- RRR , no murmur , no gallop  , no rub, nl s1 s2                           - JVD- none , edema R>L trace, stasis changes- none, varices- none           Lung- + diminished with a few crackles, wheeze- none, cough+light , dullness-none, rub- none           Chest  wall- intertrigo Abd-  Br/ Gen/ Rectal- Not done, not indicated Extrem- cyanosis- none, clubbing, none, atrophy- none, strength- nl Neuro- +resting tremor/ head bob

## 2013-03-05 NOTE — Progress Notes (Signed)
Quick Note:  LMTCB ______ 

## 2013-03-06 NOTE — Assessment & Plan Note (Signed)
I emphasized the importance of smoking cessation and the availability of support. She is not yet engaged with this effort

## 2013-03-06 NOTE — Assessment & Plan Note (Signed)
She will try to help [place] track down the CT scan she says showed "nodules" and meanwhile we will do chest x-ray

## 2013-03-06 NOTE — Assessment & Plan Note (Addendum)
Good compliance and control with CPAP 17/Advanced. Plan-DME to provide humidifier. She can also ask her pharmacist for alternatives to Biotene, which didn't do much

## 2013-03-10 NOTE — Progress Notes (Signed)
Quick Note:  LMTCB ______ 

## 2013-03-18 ENCOUNTER — Telehealth: Payer: Self-pay | Admitting: Internal Medicine

## 2013-03-18 NOTE — Telephone Encounter (Signed)
Notes Recorded by Waymon Budge, MD on 02/23/2013 at 8:56 PM CXR- old scarringin left lng, nothing new or active   I spoke with patient about results and she verbalized understanding and had no questions

## 2013-04-12 ENCOUNTER — Ambulatory Visit: Payer: Medicare Other | Attending: Family Medicine

## 2013-04-12 DIAGNOSIS — IMO0001 Reserved for inherently not codable concepts without codable children: Secondary | ICD-10-CM | POA: Insufficient documentation

## 2013-04-12 DIAGNOSIS — M2560 Stiffness of unspecified joint, not elsewhere classified: Secondary | ICD-10-CM | POA: Insufficient documentation

## 2013-04-12 DIAGNOSIS — M546 Pain in thoracic spine: Secondary | ICD-10-CM | POA: Insufficient documentation

## 2013-04-20 ENCOUNTER — Ambulatory Visit: Payer: Medicare Other

## 2013-04-22 ENCOUNTER — Ambulatory Visit: Payer: Medicare Other

## 2013-06-02 IMAGING — CR DG CHEST 2V
2 series · 2 of 2 positions shown · non-contrast
Comparison: 09/15/2008

CLINICAL DATA: Cough, severe shortness of breath, fever,
congestion, smoker, asthma

CHEST - 2 VIEW

[w chest lat]
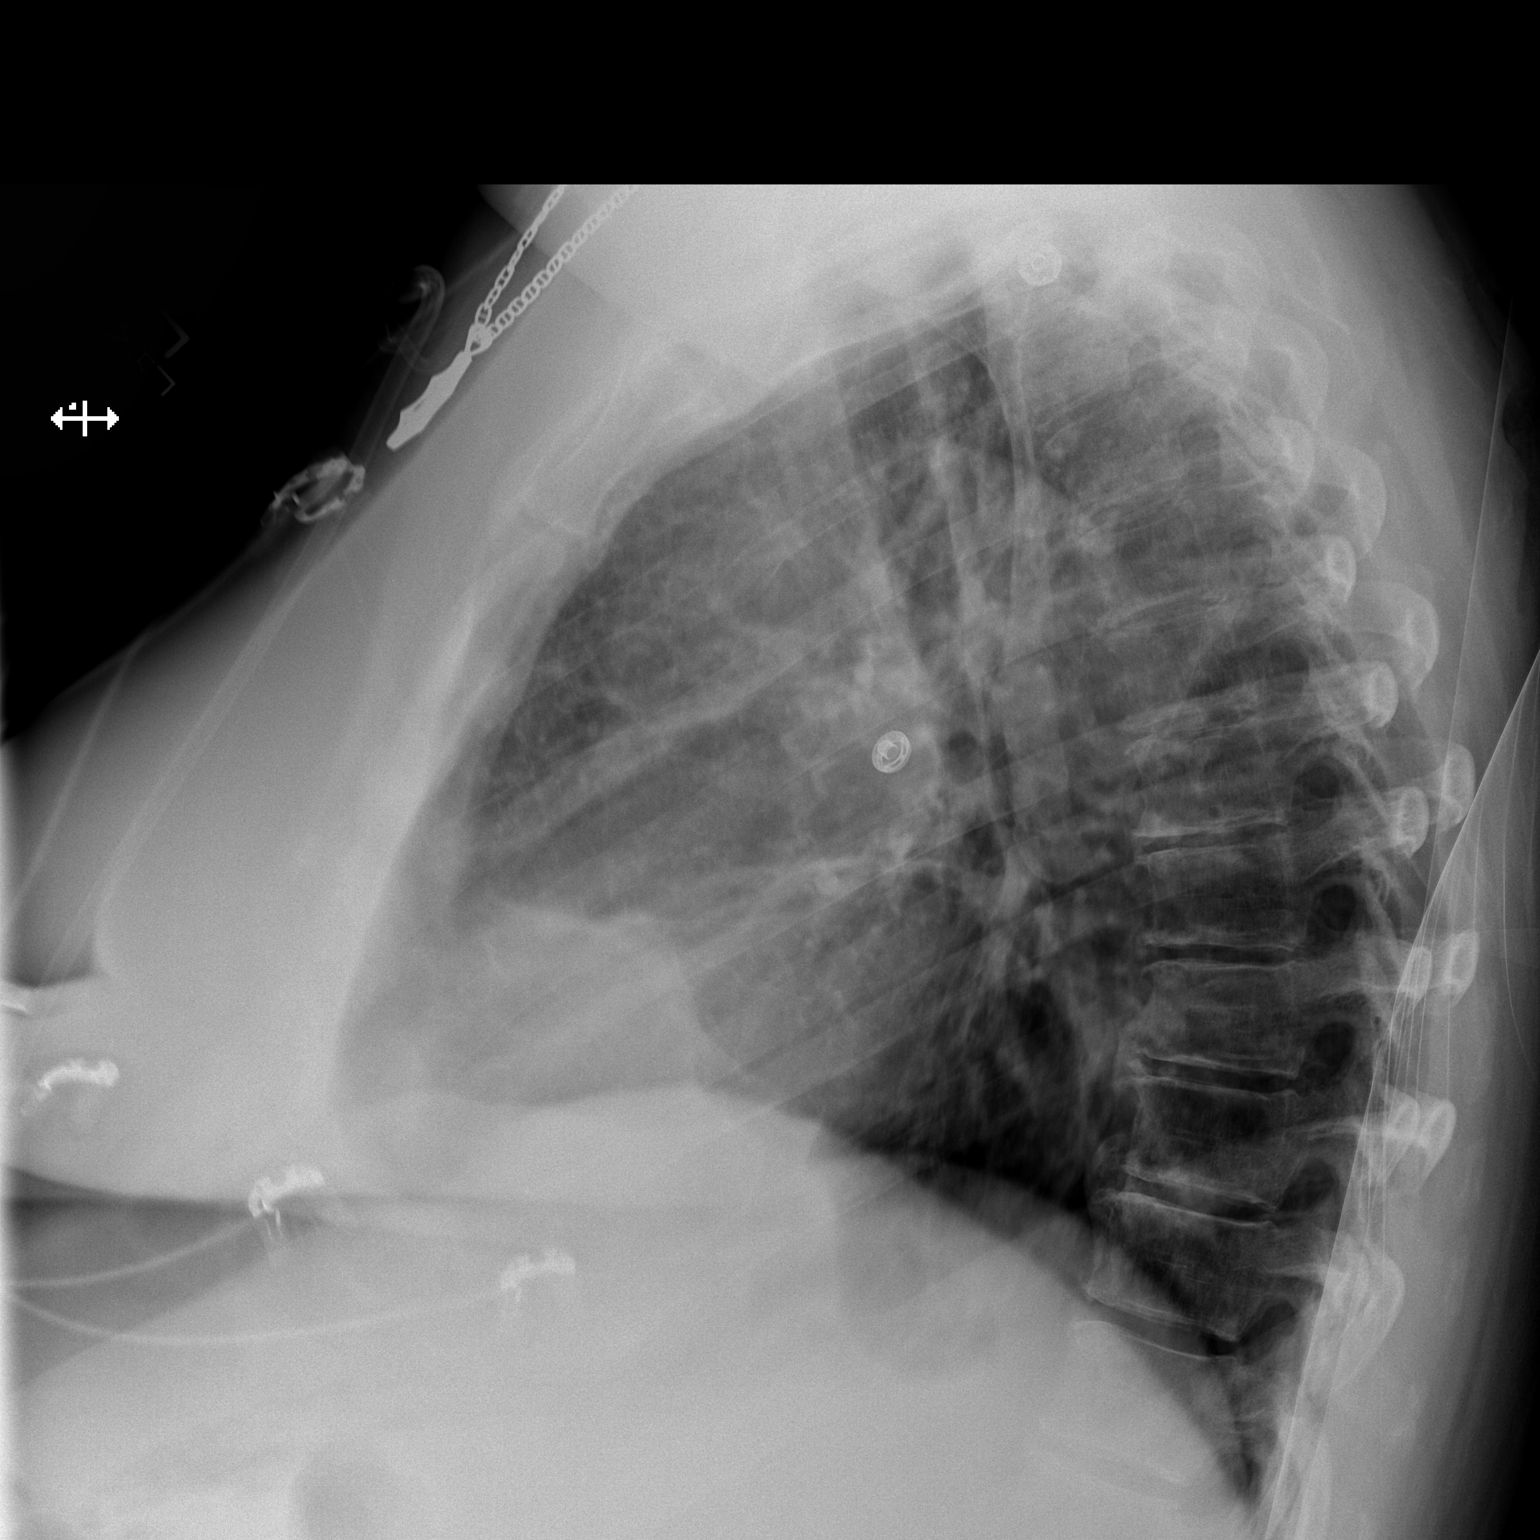

[x chest ap]
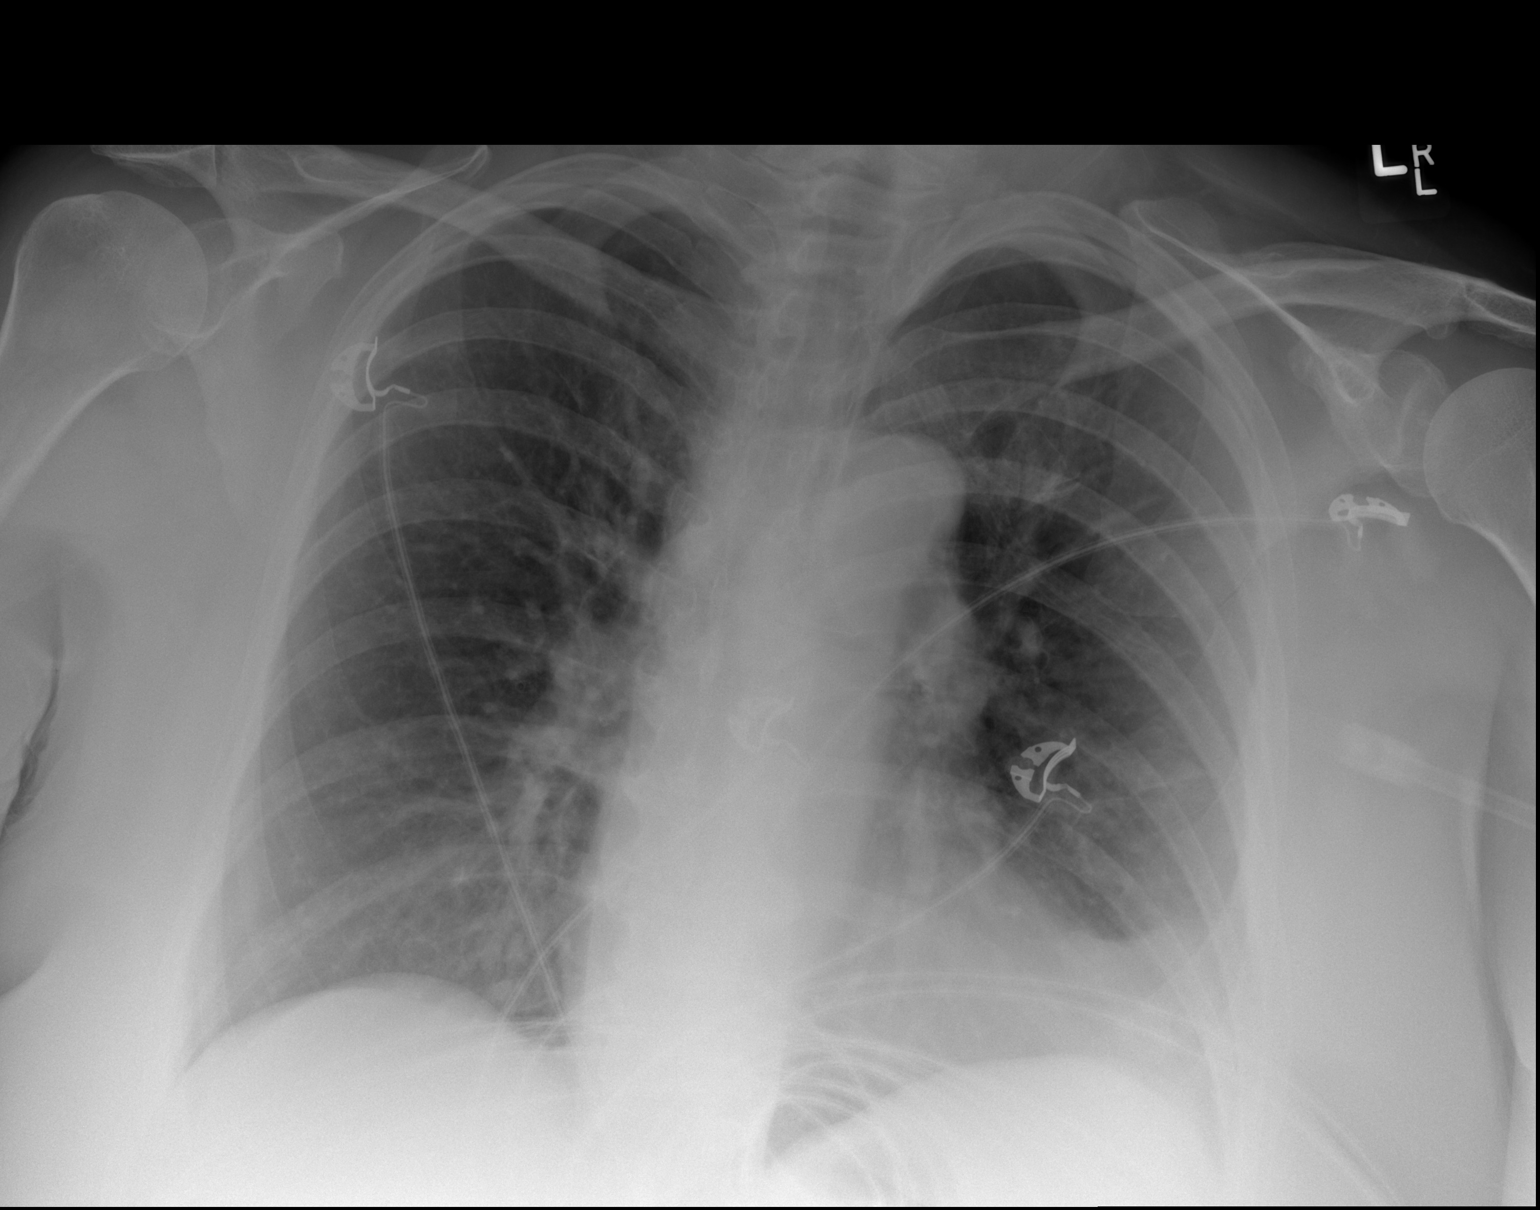

[2 of 2 positions shown; findings below may reference images not displayed]

FINDINGS: Normal heart size, mediastinal contours, and pulmonary vascularity.
Minimal chronic subsegmental atelectasis versus scarring at
lingula.
No definite acute infiltrate, pleural effusion or pneumothorax.
No acute osseous findings.
IMPRESSION: No acute abnormalities.

## 2013-06-03 IMAGING — CR DG CHEST 1V PORT
1 series · 1 of 1 positions shown · non-contrast
Comparison: 08/07/2011.

CLINICAL DATA: Pneumonia.  Short of breath.  Cough and congestion.

PORTABLE CHEST - 1 VIEW

[AP]
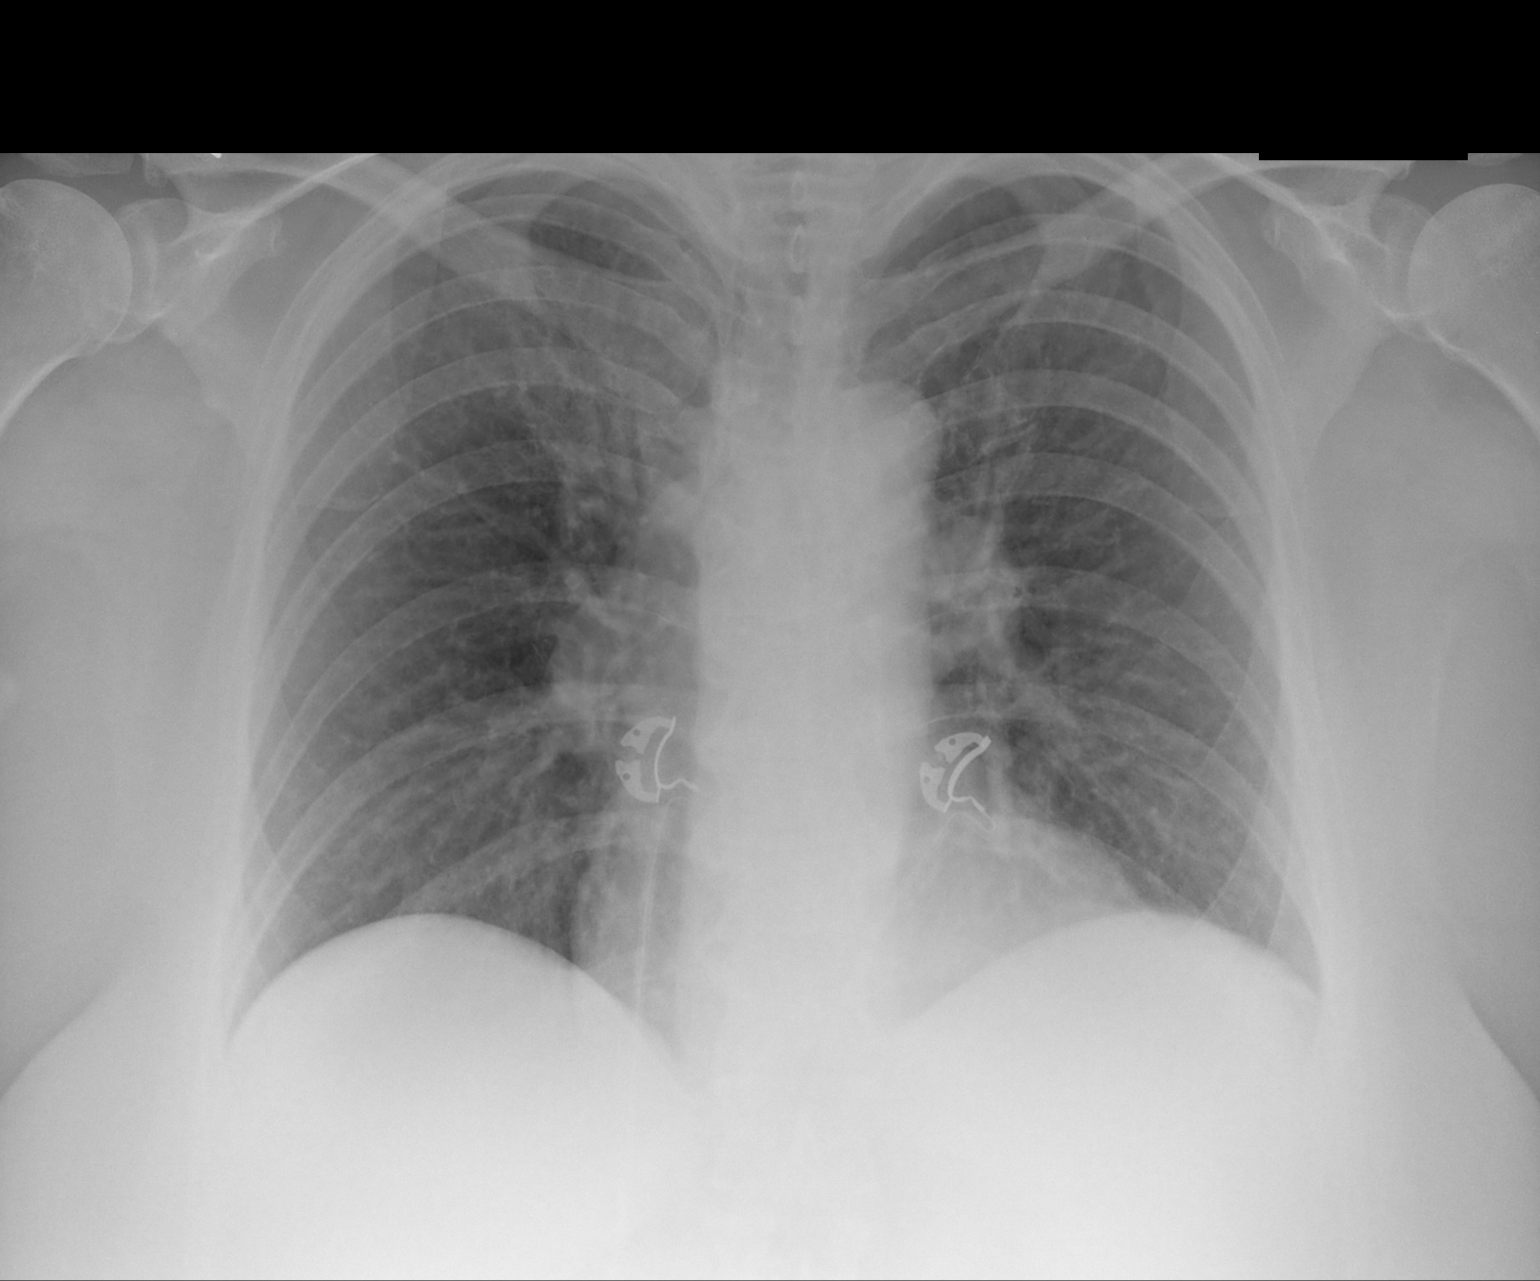

[1 of 1 positions shown; findings below may reference images not displayed]

FINDINGS: Low volume chest.  Basilar atelectasis.  No airspace
disease.  No effusion.  Cardiopericardial silhouette appears within
normal limits. Monitoring leads are projected over the chest.
IMPRESSION: Low volume chest without acute cardiopulmonary disease.

## 2013-06-04 IMAGING — CR DG CHEST 1V PORT
1 series · 1 of 1 positions shown · non-contrast
Comparison: 08/08/2011

CLINICAL DATA: Pneumonia.

PORTABLE CHEST - 1 VIEW

[AP]
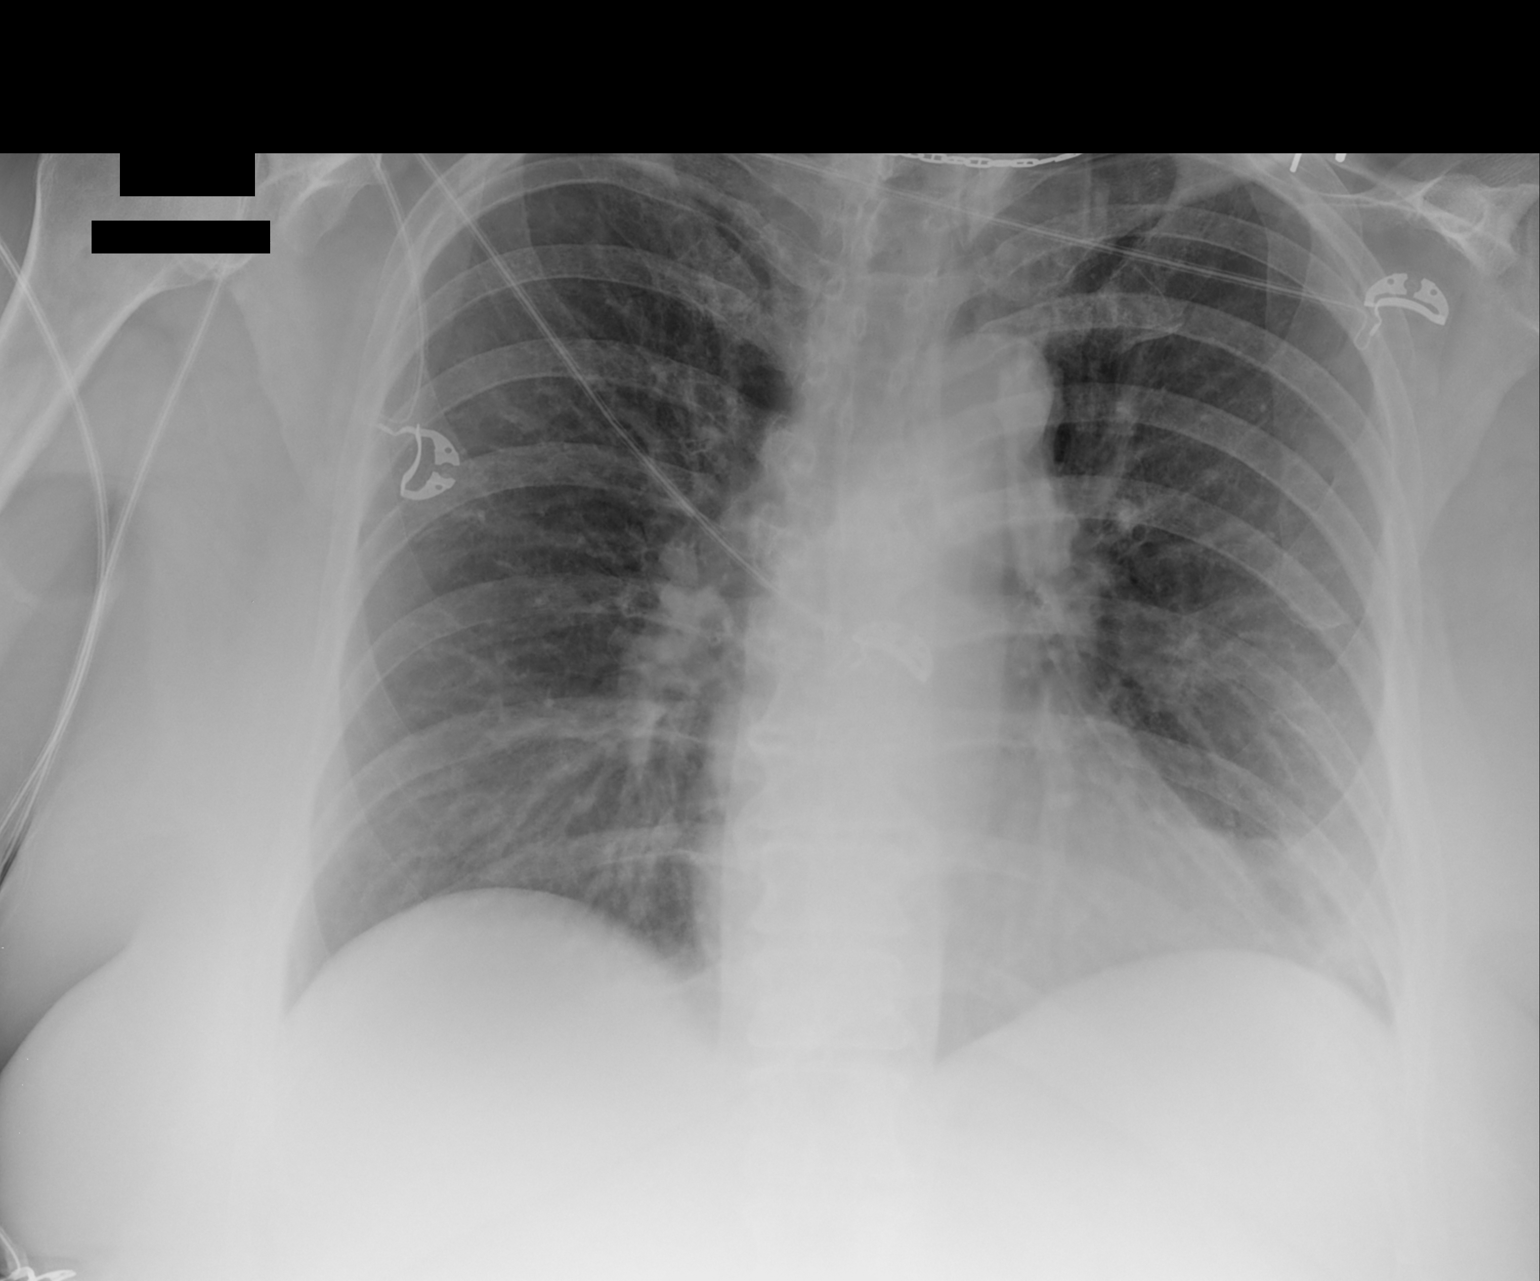

[1 of 1 positions shown; findings below may reference images not displayed]

FINDINGS: Heart is normal size.  Diffuse peribronchial thickening
and interstitial prominence persist, unchanged.  No effusions.  No
acute bony abnormality.
IMPRESSION: Stable peribronchial thickening and interstitial prominence.

## 2014-02-24 ENCOUNTER — Encounter: Payer: Self-pay | Admitting: Internal Medicine

## 2014-02-24 ENCOUNTER — Ambulatory Visit (INDEPENDENT_AMBULATORY_CARE_PROVIDER_SITE_OTHER)
Admission: RE | Admit: 2014-02-24 | Discharge: 2014-02-24 | Disposition: A | Discharge status: Home / Self Care | Payer: Medicare Other | Source: Ambulatory Visit | Attending: Internal Medicine | Admitting: Internal Medicine

## 2014-02-24 ENCOUNTER — Ambulatory Visit (INDEPENDENT_AMBULATORY_CARE_PROVIDER_SITE_OTHER): Payer: Medicare Other | Admitting: Internal Medicine

## 2014-02-24 VITALS — BP 124/60 | HR 74 | Ht 68.5 in | Wt 273.0 lb

## 2014-02-24 DIAGNOSIS — G4733 Obstructive sleep apnea (adult) (pediatric): Secondary | ICD-10-CM

## 2014-02-24 DIAGNOSIS — Z720 Tobacco use: Secondary | ICD-10-CM

## 2014-02-24 DIAGNOSIS — J984 Other disorders of lung: Secondary | ICD-10-CM

## 2014-02-24 DIAGNOSIS — F17200 Nicotine dependence, unspecified, uncomplicated: Secondary | ICD-10-CM

## 2014-02-24 DIAGNOSIS — J449 Chronic obstructive pulmonary disease, unspecified: Secondary | ICD-10-CM

## 2014-02-24 MED ORDER — ALBUTEROL SULFATE HFA 108 (90 BASE) MCG/ACT IN AERS: 2.0000 | INHALATION_SPRAY | Freq: Four times a day (QID) | RESPIRATORY_TRACT | Status: DC | PRN

## 2014-02-24 NOTE — Patient Instructions (Signed)
Order- DME [facility]- ONOX on room air On CPAP    Dx OSA  Script printed to refill rescue inhaler  Order CXR   Dx Tobacco user  Order office spirometry  Dx COPD without exacerbation

## 2014-02-24 NOTE — Progress Notes (Signed)
22 yoF former smoker, followed by Dr Joya Gaskins for COPD w/ hx pneumonia. Has hx OSA and wants to restart CPAP.  NPSG 01/04/03 [place] Severe OSA, RDI.( AHI )32.3/hr. [place] 11/24, weight 270 lbs. Dr. Joya Gaskins had seen her once for diagnoses of COPD. She was hospitalized with the flu syndrome and pneumonia and since then has been waking, gasping at night and short of breath with exertion. The pneumonia cleared by chest x-ray but her dyspnea has not improved. She is in a separate bedroom from her husband, who uses CPAP, so he does not report problems during her sleep. Trazodone is being used as a sleep medication. She has a previous diagnosis of delayed pressure urticaria. CPAP mask and straps were causing this to flare. She dropped off of CPAP several years ago. Has tried oral appliances without being able to see satisfactory improvement. Recently she has tried wearing her husband's CPAP to get an extra hour of sleep in the morning while he is getting up and going for the day. Overnight oximetry demonstrated frequent desaturation. Surgical history includes tonsils, sinus surgery twice by Dr. Ernesto Rutherford. Bedtime between midnight and 2 AM, estimating 30-60 minutes sleep latency. Wakes 6 or more times per night and gets up between noon and 2 PM. She is not employed. 10 pound weight gain in the last year.  11/28/11- 54 yoF former smoker, followed by Dr Annamaria Boots for OSA and by Dr Joya Gaskins for COPD w/ hx pneumonia. Wears CPAP every night approx 8-10 hours; pressure could be more at times per patient. Sleeping better. She wants help stopping tobacco.  Patches caused skin breakout. Still at 1-1/2 packs per day. Chest x-ray 08/10/2011-stable bronchitis changes.  01/28/12- 89 yoF former smoker, followed by Dr Annamaria Boots for OSA and by Dr Joya Gaskins for COPD w/ hx pneumonia. PCP Dr Jeralene Huff Wears CPAP every night for approximately 8-12 hours; pressure working well- however at times feels like the pressure could be increased. SOB increased  (gotten worse since heat started), denies any wheezing, cough, or congestion Hospitalized in January for pneumonia/flu. She is following with me now for her COPD as well as her sleep apnea, because she didn't want to deal with 2 different doctors. She continues CPAP 15/Advanced, using a nasal mask. She describes very good compliance and control but she would like to try a little higher pressure for comfort. Her COPD assessment test (CAT) score 22/40 Denies significant breathing problems in the spring. Some increased shortness of breath now with hot humid weather. She still smokes 1-1/2 packs per day despite counseling efforts. Daily Zyrtec. Now diagnosed with diabetes.  04/16/12- 61 yoF  1.5 ppd smoker, followed by Dr Annamaria Boots for OSA and by Dr Joya Gaskins for COPD w/ hx pneumonia.   PCP Dr Jeralene Huff Pt states wears CPAP 17/ Advanced approx 8-12 hours night 7 days a week. Has some issues with mask but will speak with DME company. Likes this pressure. Has had flu vaccine. Primary physician treated prednisone and Z-Pak for an acute bronchitis and she has had chest x-ray CXR 01/31/12 IMPRESSION:  No acute disease.  Original Report Authenticated By: Larey Seat, M.D.   [birth year]- 57 yoF  1.5 ppd smoker, followed for  OSA and COPD w/ hx pneumonia. FOLLOWS FOR: wears CPAP 17/ Advanced every night for about 10 hours; pressure working well for patient.; Pt would like to have her O2 checked while walking-feels like she gives out of air.  No effort to stop smoking. Dry cough and dry mouth despite  Biotene. Says she is doing "fine" on CPAP. PFT 08/26/2011-restriction. FVC 68%, FEV1 62%, FEV1/FVC 0.93. CXR 04/24/12 IMPRESSION:  Slightly displaced fracture of the anterolateral aspect of the left  eighth rib. No other rib fractures evident. There is minimal left  basilar atelectasis. No pleural effusion or pneumothorax is  evident. Cardiac silhouette is borderline size.  Original Report Authenticated By: Delane Ginger, M.D.  02/23/13- 63 yoF  1.5 ppd smoker, followed for  OSA and COPD w/ hx pneumonia. FOLLOWS FOR: pt reports wearing CPAP 17/ Advanced everynight x12 hrs per night, tolerating pressure settings ok-- no supplies needed at this time Still smoking-we discussed this again.. Dry mouth, never used humidifier, but recognizes effect of Ditropan. Says she had a CT scan by her PCP "nodules" we don't have that report in our system and she will try to get it for [place].  02/24/14- 42 yoF  1.5 ppd smoker, followed for  OSA and COPD w/ hx pneumonia, tobacco abuse. FOLLOWS FOR: wears CPAP 17/ Advanced every night-does have some nights of insomnia. DME is [facility]. No effort to stop smoking despite counseling. Has a cat. Admits morning cough, anxiety. She feels she is doing well with CPAP with no concerns or changes requested. She  does note that she originally had oxygen through her CPAP, but no longer. Questions if she should. CPAP download shows good control during autotitration with a pressure of 14 being appropriate. Good compliance. She definitely feels it helps her and wishes to continue. CXR 03/18/13 IMPRESSION:  Lingular scarring. No acute findings.  Original Report Authenticated By: Rolm Baptise, M.D. Office Spirometry 02/24/2014-mild obstruction and restriction of exhaled volume. FVC 2.64/72%, FEV1 1.80/63%, FEV1/FVC 0.68, FEF 25-75% 0.92/35%.  ROS-see HPI Constitutional:   No-   weight loss, night sweats, fevers, chills, fatigue, lassitude. HEENT:   No-  headaches, difficulty swallowing, tooth/dental problems, sore throat,       No-  sneezing, itching, ear ache, nasal congestion, post nasal drip,  CV:  No-   chest pain, orthopnea, PND, swelling in lower extremities, anasarca, dizziness, palpitations Resp: +  shortness of breath with exertion or at rest.              No-   productive cough,  +non-productive cough,  No- coughing up of blood.              No-   change in color of mucus.  No- wheezing.    Skin: No-   rash or lesions. GI:  No-   heartburn, indigestion, abdominal pain, nausea, vomiting, GU: MS:  No-   joint pain or swelling.  Neuro-     nothing unusual Psych:  No- change in mood or affect. No depression or anxiety.  No memory loss.  OBJ- Physical Exam General- Alert, Oriented, Affect-appropriate, Distress- none acute, obese, + odor tobacco Skin- rash-none, lesions- none, excoriation- none. Scratching at herself. Lichenified, excoriated areas especially on her arms Lymphadenopathy- none Head- atraumatic            Eyes- Gross vision intact, PERRLA, conjunctivae and secretions clear, strabismus            Ears- Hearing, canals-normal            Nose- Clear, no-Septal dev, mucus, polyps, erosion, perforation             Throat- Mallampati II-III , mucosa clear- not very dry , drainage- none, tonsils- atrophic.+ Mild  hoarseness Neck- flexible , trachea midline, no stridor , thyroid nl, carotid no bruit Chest - symmetrical excursion , unlabored           Heart/CV- RRR , no murmur , no gallop  , no rub, nl s1 s2                           - JVD- none , edema R>L trace, stasis changes- none, varices- none           Lung- + diminished but clear, wheeze- none, cough-none , dullness-none, rub- none           Chest wall- intertrigo Abd-  Br/ Gen/ Rectal- Not done, not indicated Extrem- cyanosis- none, clubbing, none, atrophy- none, strength- nl Neuro- +resting tremor/ head bob

## 2014-02-27 NOTE — Assessment & Plan Note (Signed)
Mild restriction is partly from air trapping and partly from body habitus. I don't think she has significant restrictive lung disease.

## 2014-02-27 NOTE — Assessment & Plan Note (Signed)
She questions need for oxygen with CPAP. Plan-overnight oximetry on room air while wearing CPAP

## 2014-02-27 NOTE — Assessment & Plan Note (Signed)
counseling 

## 2014-02-27 NOTE — Assessment & Plan Note (Signed)
FEV1 is stable since 2013 around 62% predicted. She is lucky not to have faster declined as she continues smoking. Plan-tobacco counseling, chest x-ray, refill rescue inhaler

## 2014-03-07 ENCOUNTER — Telehealth: Payer: Self-pay | Admitting: Internal Medicine

## 2014-03-07 NOTE — Telephone Encounter (Signed)
Called pt to let her know [facility] trying to call her LMTCB x1

## 2014-03-08 NOTE — Telephone Encounter (Signed)
I LM on pt VM per release advising her that [facility] is trying to set up ONO and that she should contact them. # provided. [place] Bing, CMA

## 2014-03-24 ENCOUNTER — Telehealth: Payer: Self-pay | Admitting: Internal Medicine

## 2014-03-24 DIAGNOSIS — G4733 Obstructive sleep apnea (adult) (pediatric): Secondary | ICD-10-CM

## 2014-03-24 DIAGNOSIS — J438 Other emphysema: Secondary | ICD-10-CM

## 2014-03-24 NOTE — Telephone Encounter (Signed)
This may be in scanning process ??

## 2014-03-24 NOTE — Telephone Encounter (Signed)
LMTCB

## 2014-03-24 NOTE — Telephone Encounter (Signed)
Pt is calling to get the results of her ONO.  I did not see the results in her chart. Pt stated that if she does not answer when we call back we can leave the results on her machine.  CY please advise. Thanks  Last ov--02/24/14 Next ov--08/26/14  Allergies  Allergen Reactions  . Nicotine Other (See Comments)    PATCH- gives blisters and rash  . Ppd [Tuberculin Purified Protein Derivative]     Pt states she has hives and itching with this test  . Sulfa Antibiotics Other (See Comments)    Dizzines, nausea  . Penicillins Rash   Current Outpatient Prescriptions on File Prior to Visit  Medication Sig Dispense Refill  . albuterol (PROVENTIL HFA;VENTOLIN HFA) 108 (90 BASE) MCG/ACT inhaler Inhale 2 puffs into the lungs every 6 (six) hours as needed for wheezing or shortness of breath. wheezing  1 Inhaler  prn  . albuterol (PROVENTIL) (5 MG/ML) 0.5% nebulizer solution Take 2.5 mg by nebulization every 4 (four) hours as needed.      . ALPRAZolam (XANAX) 0.5 MG tablet Take 1-2 tablets qd prn      . amLODipine (NORVASC) 10 MG tablet Take 10 mg by mouth daily.      Marland Kitchen BYSTOLIC 20 MG TABS Take 1 tablet by mouth daily.      . cetirizine (ZYRTEC) 10 MG tablet Take 10 mg by mouth daily.      . DULoxetine (CYMBALTA) 60 MG capsule Take 60 mg by mouth daily.      Marland Kitchen JANUVIA 50 MG tablet Take 1 tablet by mouth daily.      Marland Kitchen labetalol (NORMODYNE) 200 MG tablet Take 1 tablet by mouth 2 (two) times daily.      . lansoprazole (PREVACID) 30 MG capsule Take 30 mg by mouth every other day.      . metFORMIN (GLUCOPHAGE) 1000 MG tablet Take 1,000 mg by mouth 2 (two) times daily with a meal.      . oxybutynin (DITROPAN-XL) 10 MG 24 hr tablet Take 10 mg by mouth 2 (two) times daily.      Marland Kitchen oxyCODONE (OXYCONTIN) 10 MG 12 hr tablet Take 10 mg by mouth daily.      . pravastatin (PRAVACHOL) 40 MG tablet Take 40 mg by mouth daily.      . traZODone (DESYREL) 100 MG tablet Take 100-200 mg by mouth at bedtime.      .  triamterene-hydrochlorothiazide (MAXZIDE-25) 37.5-25 MG per tablet Take 1 tablet by mouth daily.      . valsartan-hydrochlorothiazide (DIOVAN-HCT) 320-25 MG per tablet Take 1 tablet by mouth daily.       No current facility-administered medications on file prior to visit.

## 2014-03-28 NOTE — Telephone Encounter (Signed)
I called [facility] to have them resend the results as I do not see any results in EPIC on patient. [facility] is faxing results to Triage fax to my attn.

## 2014-03-28 NOTE — Telephone Encounter (Signed)
lmam on her machine if she doesn't answer [phone]

## 2014-03-28 NOTE — Telephone Encounter (Signed)
Katie have you received results yet? thanks

## 2014-04-01 NOTE — Telephone Encounter (Signed)
Fax received and placed on CDY cart. Please advise thanks

## 2014-04-01 NOTE — Telephone Encounter (Signed)
Overnight oximetry shows desaturation on CPAP with room air.   Recommend order DME [facility]- order addition home O2 2 L through CPAP for sleep. Dx OSA, COPD with emphysema.

## 2014-04-01 NOTE — Telephone Encounter (Signed)
LMTCB

## 2014-04-01 NOTE — Telephone Encounter (Signed)
No sign of the ONO [facility] and spoke with Wannetta Sender who will fax this again to the triage fax. Will await fax and place on CY's cart

## 2014-04-05 NOTE — Telephone Encounter (Signed)
I spoke with patient about results and she verbalized understanding and had no questions Per pt she already has O2 concentrator at home.  Order placed in other orders for [facility]

## 2014-04-05 NOTE — Telephone Encounter (Signed)
lmomtcb for pt 

## 2014-04-05 NOTE — Telephone Encounter (Signed)
Jiles Crocker --Inge Rise, CMA            It looks like this pt already has nocturnal o2 with [place]. Per OV note from Dr. Annamaria Boots, the pt was questioning the need for the o2 so he order an ono on ra and cpap. According to the most recent ono from 8/16, the pt's sats didn't drop below 88% for more than 5 minutes. It shows the pt dropped for 4 minutes which does not qualify for o2 per Medicare Guidelines. Please advise. Thanks    Please advise thanks

## 2014-08-26 ENCOUNTER — Ambulatory Visit: Payer: Medicare Other | Admitting: Internal Medicine

## 2014-09-05 ENCOUNTER — Encounter: Payer: Self-pay | Admitting: Internal Medicine

## 2014-09-05 ENCOUNTER — Ambulatory Visit (INDEPENDENT_AMBULATORY_CARE_PROVIDER_SITE_OTHER): Payer: Medicare Other | Admitting: Internal Medicine

## 2014-09-05 VITALS — BP 130/60 | HR 71 | Ht 68.5 in | Wt 273.6 lb

## 2014-09-05 DIAGNOSIS — G4733 Obstructive sleep apnea (adult) (pediatric): Secondary | ICD-10-CM

## 2014-09-05 DIAGNOSIS — Z720 Tobacco use: Secondary | ICD-10-CM

## 2014-09-05 DIAGNOSIS — J410 Simple chronic bronchitis: Secondary | ICD-10-CM

## 2014-09-05 MED ORDER — CYCLOBENZAPRINE HCL 10 MG PO TABS: 10.0000 mg | ORAL_TABLET | Freq: Three times a day (TID) | ORAL | Status: DC | PRN

## 2014-09-05 MED ORDER — METHOCARBAMOL 750 MG PO TABS: 750.0000 mg | ORAL_TABLET | Freq: Four times a day (QID) | ORAL | Status: DC

## 2014-09-05 NOTE — Patient Instructions (Addendum)
Order- DME [facility]- download CPAP for pressure compliance   Dx OSA  Please keep trying to stop smoking  Script for Flexeril and Robaxin just this time  Please call as needed

## 2014-09-05 NOTE — Progress Notes (Signed)
22 yoF former smoker, followed by Dr Joya Gaskins for COPD w/ hx pneumonia. Has hx OSA and wants to restart CPAP.  NPSG 01/04/03 [place] Severe OSA, RDI.( [facility] )32.3/hr. [place] 11/24, weight 270 lbs. Dr. Joya Gaskins had seen her once for diagnoses of COPD. She was hospitalized with the flu syndrome and pneumonia and since then has been waking, gasping at night and short of breath with exertion. The pneumonia cleared by chest x-ray but her dyspnea has not improved. She is in a separate bedroom from her husband, who uses CPAP, so he does not report problems during her sleep. Trazodone is being used as a sleep medication. She has a previous diagnosis of delayed pressure urticaria. CPAP mask and straps were causing this to flare. She dropped off of CPAP several years ago. Has tried oral appliances without being able to see satisfactory improvement. Recently she has tried wearing her husband's CPAP to get an extra hour of sleep in the morning while he is getting up and going for the day. Overnight oximetry demonstrated frequent desaturation. Surgical history includes tonsils, sinus surgery twice by Dr. Ernesto Rutherford. Bedtime between midnight and 2 AM, estimating 30-60 minutes sleep latency. Wakes 6 or more times per night and gets up between noon and 2 PM. She is not employed. 10 pound weight gain in the last year.  11/28/11- 54 yoF former smoker, followed by Dr Annamaria Boots for OSA and by Dr Joya Gaskins for COPD w/ hx pneumonia. Wears CPAP every night approx 8-10 hours; pressure could be more at times per patient. Sleeping better. She wants help stopping tobacco.  Patches caused skin breakout. Still at 1-1/2 packs per day. Chest x-ray 08/10/2011-stable bronchitis changes.  01/28/12- 89 yoF former smoker, followed by Dr Annamaria Boots for OSA and by Dr Joya Gaskins for COPD w/ hx pneumonia. PCP Dr Jeralene Huff Wears CPAP every night for approximately 8-12 hours; pressure working well- however at times feels like the pressure could be increased. SOB increased  (gotten worse since heat started), denies any wheezing, cough, or congestion Hospitalized in January for pneumonia/flu. She is following with me now for her COPD as well as her sleep apnea, because she didn't want to deal with 2 different doctors. She continues CPAP 15/[facility], using a nasal mask. She describes very good compliance and control but she would like to try a little higher pressure for comfort. Her COPD assessment test (CAT) score 22/40 Denies significant breathing problems in the spring. Some increased shortness of breath now with hot humid weather. She still smokes 1-1/2 packs per day despite counseling efforts. Daily Zyrtec. Now diagnosed with diabetes.  04/16/12- 61 yoF  1.5 ppd smoker, followed by Dr Annamaria Boots for OSA and by Dr Joya Gaskins for COPD w/ hx pneumonia.   PCP Dr Jeralene Huff Pt states wears CPAP 17/ Advanced approx 8-12 hours night 7 days a week. Has some issues with mask but will speak with DME company. Likes this pressure. Has had flu vaccine. Primary physician treated prednisone and Z-Pak for an acute bronchitis and she has had chest x-ray CXR 01/31/12 IMPRESSION:  No acute disease.  Original Report Authenticated By: Larey Seat, M.D.   [birth year]- 57 yoF  1.5 ppd smoker, followed for  OSA and COPD w/ hx pneumonia. FOLLOWS FOR: wears CPAP 17/ [facility] every night for about 10 hours; pressure working well for patient.; Pt would like to have her O2 checked while walking-feels like she gives out of air.  No effort to stop smoking. Dry cough and dry mouth despite  Biotene. Says she is doing "fine" on CPAP. PFT 08/26/2011-restriction. FVC 68%, FEV1 62%, FEV1/FVC 0.93. CXR 04/24/12 IMPRESSION:  Slightly displaced fracture of the anterolateral aspect of the left  eighth rib. No other rib fractures evident. There is minimal left  basilar atelectasis. No pleural effusion or pneumothorax is  evident. Cardiac silhouette is borderline size.  Original Report Authenticated By: Delane Ginger, M.D.  02/23/13- 10 yoF  1.5 ppd smoker, followed for  OSA and COPD w/ hx pneumonia. FOLLOWS FOR: pt reports wearing CPAP 17/ Advanced everynight x12 hrs per night, tolerating pressure settings ok-- no supplies needed at this time Still smoking-we discussed this again.. Dry mouth, never used humidifier, but recognizes effect of Ditropan. Says she had a CT scan by her PCP "nodules" we don't have that report in our system and she will try to get it for [place].  02/24/14- 66 yoF  1.5 ppd smoker, followed for  OSA and COPD w/ hx pneumonia, tobacco abuse. FOLLOWS FOR: wears CPAP 17/ Advanced every night-does have some nights of insomnia. DME is [facility]. No effort to stop smoking despite counseling. Has a cat. Admits morning cough, anxiety. She feels she is doing well with CPAP with no concerns or changes requested. She  does note that she originally had oxygen through her CPAP, but no longer. Questions if she should. CPAP download shows good control during autotitration with a pressure of 14 being appropriate. Good compliance. She definitely feels it helps her and wishes to continue. CXR 03/18/13 IMPRESSION:  Lingular scarring. No acute findings.  Original Report Authenticated By: Rolm Baptise, M.D. Office Spirometry 02/24/2014-mild obstruction and restriction of exhaled volume. FVC 2.64/72%, FEV1 1.80/63%, FEV1/FVC 0.68, FEF 25-75% 0.92/35%.  09/05/14- 36 yoF  1.5 ppd smoker, followed for  OSA and COPD w/ hx pneumonia, tobacco abuse. FOLLOWS FOR: Pt wears cpap 17/ Advanced nightly, she could not give # hours due to she is constantly up and down. She has sob with exertion. Breathing is comfortable but she continues to smoke-discussed again Not using oxygen because she says her cat choose through the plastic line as it is inconvenient because she is up frequently for nocturia. ONOX on room air with CPAP 03/03/14- did not meet qualifying levels Asks muscle relaxant for pulled back muscle CXR  01/28/14 IMPRESSION: No active cardiopulmonary disease. Electronically Signed  By: Marin Olp M.D.  On: 02/25/2014 08:06  ROS-see HPI Constitutional:   No-   weight loss, night sweats, fevers, chills, fatigue, lassitude. HEENT:   No-  headaches, difficulty swallowing, tooth/dental problems, sore throat,       No-  sneezing, itching, ear ache, nasal congestion, post nasal drip,  CV:  No-   chest pain, orthopnea, PND, swelling in lower extremities, anasarca, dizziness, palpitations Resp: +  shortness of breath with exertion or at rest.              No-   productive cough,  +non-productive cough,  No- coughing up of blood.              No-   change in color of mucus.  No- wheezing.   Skin: No-   rash or lesions. GI:  No-   heartburn, indigestion, abdominal pain, nausea, vomiting, GU: MS:  No-   joint pain or swelling. + Pulled muscle and back Neuro-     nothing unusual Psych:  No- change in mood or affect. No depression or anxiety.  No memory loss.  OBJ- Physical Exam General- Alert, Oriented, Affect-appropriate,  Distress- none acute, obese, + odor tobacco Skin- rash-none, lesions- none, excoriation- none.  Lymphadenopathy- none Head- atraumatic            Eyes- Gross vision intact, PERRLA, conjunctivae and secretions clear, strabismus            Ears- Hearing, canals-normal            Nose- Clear, no-Septal dev, mucus, polyps, erosion, perforation             Throat- Mallampati II-III , mucosa clear , drainage- none, tonsils- atrophic.+ Mild hoarseness Neck- flexible , trachea midline, no stridor , thyroid nl, carotid no bruit Chest - symmetrical excursion , unlabored           Heart/CV- RRR , no murmur , no gallop  , no rub, nl s1 s2                           - JVD- none , edema R>L trace, stasis changes- none, varices- none           Lung- + diminished but clear, wheeze- none, cough-none , dullness-none, rub- none           Chest wall- intertrigo Abd-  Br/ Gen/ Rectal- Not  done, not indicated Extrem- cyanosis- none, clubbing, none, atrophy- none, strength- nl Neuro- +resting tremor/ head bob

## 2014-09-08 ENCOUNTER — Telehealth: Payer: Self-pay | Admitting: Internal Medicine

## 2014-09-08 DIAGNOSIS — J410 Simple chronic bronchitis: Secondary | ICD-10-CM

## 2014-09-08 NOTE — Telephone Encounter (Signed)
Magda Paganini spoke with Lenna Sciara and is working on this

## 2014-09-08 NOTE — Telephone Encounter (Signed)
Melissa calling from [facility] about this as well she can be reached @ [phone].Hillery Hunter

## 2014-09-11 ENCOUNTER — Encounter: Payer: Self-pay | Admitting: Internal Medicine

## 2014-09-11 NOTE — Assessment & Plan Note (Signed)
Key is smoking cessation but she has not been motivated. She does not need oxygen for sleep

## 2014-09-11 NOTE — Assessment & Plan Note (Signed)
I offer support and encourage smoking cessation. She is not trying.

## 2014-09-11 NOTE — Assessment & Plan Note (Signed)
CPAP compliance seems pretty good, interrupted by frequent nocturia. She does not need oxygen. Plan-DC home oxygen for sleep. Continues CPAP 17. Request download

## 2014-09-12 NOTE — Telephone Encounter (Signed)
[place] O2

## 2014-09-12 NOTE — Telephone Encounter (Signed)
lmtcb for pt.   Order placed to d/c O2.

## 2014-09-12 NOTE — Telephone Encounter (Signed)
I have sent staff message to [facility] with [facility] to see where things stand on this message.

## 2014-09-12 NOTE — Telephone Encounter (Signed)
Message from [place] with [place]:   [name] [name].  We are still needing a d/c order for her O2. She has not been using it for the last several months and no longer qualifies for it.  As a side note, her husband was using her concentrator and wanted an order for his own. We were able to work with Dr. Clayborne Dana office and got him what he needed.   We just need a d/c O2 order.   Thanks!   Will forward to CY to advise. Thanks.

## 2014-09-13 NOTE — Telephone Encounter (Signed)
Spoke with the pt and notified that order was sent to pick up o2  She verbalized understanding  Nothing further needed

## 2014-09-13 NOTE — Telephone Encounter (Signed)
lmtcb x2 

## 2015-06-07 ENCOUNTER — Ambulatory Visit (INDEPENDENT_AMBULATORY_CARE_PROVIDER_SITE_OTHER): Payer: Medicare Other | Admitting: Family Medicine

## 2015-06-07 VITALS — BP 131/74 | HR 88 | Temp 98.7°F | Resp 18 | Ht 69.0 in | Wt 277.4 lb

## 2015-06-07 DIAGNOSIS — L259 Unspecified contact dermatitis, unspecified cause: Secondary | ICD-10-CM | POA: Diagnosis not present

## 2015-06-07 MED ORDER — PREDNISONE 10 MG PO TABS: 10.0000 mg | ORAL_TABLET | Freq: Every day | ORAL | Status: DC

## 2015-06-07 NOTE — Progress Notes (Signed)
  Subjective:     [name] [name] is a 59 y.o. female who presents for evaluation of a rash involving the chest, lower extremity and upper extremity. Rash started 2 days ago. Lesions are maculopapular, and flat in texture. Rash has not changed over time. Rash is pruritic. Associated symptoms: none. Patient denies: abdominal pain, cough, fever, myalgia, sore throat and vomiting. Patient has not had contacts with similar rash. Patient has not had new exposures (soaps, lotions, laundry detergents, foods, medications, plants, insects or animals).  The following portions of the patient's history were reviewed and updated as appropriate: allergies, current medications, past family history, past medical history, past social history, past surgical history and problem list.  Review of Systems Pertinent items are noted in HPI.    Objective:    BP 131/74 mmHg  Pulse 88  Temp(Src) 98.7 F (37.1 C) (Oral)  Resp 18  Ht 5\' 9"  (1.753 m)  Wt 277 lb 6.4 oz (125.828 kg)  BMI 40.95 kg/m2  SpO2 98% General:  alert, cooperative and appears stated age  Skin:  Maculopapular rash most pronounced on chest.  No evidence of herald patch     Assessment:    contact dermatitis: unsure etiology    Plan:    Appears to be diffuse contact dermatitis.  No red flags including fever/chills/sweats.  No evidence of herald patch concerning for pityriasis.  Steroid taper, benadryl 50 mg q 6-8 hours prn.  F/U in 7-10 days if no improvement

## 2015-06-07 NOTE — Patient Instructions (Signed)
Prednisone oral solution  What is this medicine?  PREDNISONE (PRED ni sone) is a corticosteroid. It is commonly used to treat inflammation of the skin, joints, lungs, and other organs. Common conditions treated include asthma, allergies, and arthritis. It is also used for other conditions, such as blood disorders and diseases of the adrenal glands.  This medicine may be used for other purposes; ask your health care provider or pharmacist if you have questions.  What should I tell my health care provider before I take this medicine?  They need to know if you have any of these conditions:  -Cushing's syndrome  -diabetes  -glaucoma  -heart disease  -high blood pressure  -infection (especially a virus infection such as chickenpox, cold sores, or herpes)  -kidney disease  -liver disease  -mental illness  -myasthenia gravis  -osteoporosis  -seizures  -stomach or intestine problems  -thyroid disease  -an unusual or allergic reaction to prednisone, other corticosteroids, medicines, foods, dyes, or preservatives  -pregnant or trying to get pregnant  -breast-feeding  How should I use this medicine?  Take this medicine by mouth. Use a specially marked spoon or dropper to measure your dose. Ask your pharmacist if you do not have one. Do not use a household spoon. Follow the directions on the prescription label. Take with food or milk to avoid stomach upset. If you are taking this medicine once a day, take it in the morning. Do not take more medicine than you are told to take. Do not suddenly stop taking your medicine because you may develop a severe reaction. Your doctor will tell you how much medicine to take. If your doctor wants you to stop the medicine, the dose may be slowly lowered over time to avoid any side effects.  Talk to your pediatrician regarding the use of this medicine in children. Special care may be needed.  Overdosage: If you think you have taken too much of this medicine contact a poison control center or  emergency room at once.  NOTE: This medicine is only for you. Do not share this medicine with others.  What if I miss a dose?  If you miss a dose, take it as soon as you can. If it is almost time for your next dose, talk to your doctor or health care professional. You may need to miss a dose or take an extra dose. Do not take double or extra doses without advice.  What may interact with this medicine?  Do not take this medicine with any of the following medications:  -metyrapone  -mifepristone  This medicine may also interact with the following medications:  -amphotericin B  -aspirin and aspirin-like medicines  -barbiturates  -certain medicines for diabetes, like glipizide or glyburide  -cholestyramine  -cholinesterase inhibitors  -cyclosporine  -digoxin  -diuretics  -ephedrine  -female hormones, like estrogens and birth control pills  -isoniazid  -ketoconazole  -NSAIDS, medicines for pain and inflammation, like ibuprofen or naproxen  -phenytoin  -rifampin  -toxoids  -vaccines  -warfarin  This list may not describe all possible interactions. Give your health care provider a list of all the medicines, herbs, non-prescription drugs, or dietary supplements you use. Also tell them if you smoke, drink alcohol, or use illegal drugs. Some items may interact with your medicine.  What should I watch for while using this medicine?  Visit your doctor or health care professional for regular checks on your progress. If you are taking this medicine over a prolonged period, carry an   identification card with your name and address, the type and dose of your medicine, and your doctor's name and address.  This medicine may increase your risk of getting an infection. Tell your doctor or health care professional if you are around anyone with measles or chickenpox, or if you develop sores or blisters that do not heal properly.  If you are going to have surgery, tell your doctor or health care professional that you have taken this  medicine within the last twelve months.  Ask your doctor or health care professional about your diet. You may need to lower the amount of salt you eat.  This medicine may affect blood sugar levels. If you have diabetes, check with your doctor or health care professional before you change your diet or the dose of your diabetic medicine.  What side effects may I notice from receiving this medicine?  Side effects that you should report to your doctor or health care professional as soon as possible:  -allergic reactions like skin rash, itching or hives, swelling of the face, lips, or tongue  -changes in emotions or moods  -changes in vision  -depressed mood  -eye pain  -fever, chills, cough, sore throat, pain or difficulty passing urine  -increased thirst  -swelling of ankles, feet  Side effects that usually do not require medical attention (report to your doctor or health care professional if they continue or are bothersome):  -confusion, excitement, restlessness  -headache  -nausea, vomiting  -skin problems, acne, thin and shiny skin  -trouble sleeping  -weight gain  This list may not describe all possible side effects. Call your doctor for medical advice about side effects. You may report side effects to FDA at 1-800-FDA-1088.  Where should I keep my medicine?  Keep out of the reach of children in a container that small children cannot open.  Store at room temperature between 15 and 30 degrees C (59 and 86 degrees F). Protect from light. Keep container tightly closed. Throw away any unused medicine after the expiration date.  NOTE: This sheet is a summary. It may not cover all possible information. If you have questions about this medicine, talk to your doctor, pharmacist, or health care provider.      2016, Elsevier/Gold Standard. (2011-02-28 11:49:41)

## 2015-07-03 ENCOUNTER — Telehealth: Payer: Self-pay | Admitting: Internal Medicine

## 2015-07-03 NOTE — Telephone Encounter (Signed)
Called and spoke with pt Pt requested that last OV from CY be sent to PCP for review Notes sent electronically to PCP  Nothing further is needed at this time.

## 2015-09-04 DIAGNOSIS — G4700 Insomnia, unspecified: Secondary | ICD-10-CM | POA: Insufficient documentation

## 2015-09-04 DIAGNOSIS — N183 Chronic kidney disease, stage 3 unspecified: Secondary | ICD-10-CM | POA: Insufficient documentation

## 2015-09-04 DIAGNOSIS — K219 Gastro-esophageal reflux disease without esophagitis: Secondary | ICD-10-CM | POA: Insufficient documentation

## 2015-09-04 DIAGNOSIS — F341 Dysthymic disorder: Secondary | ICD-10-CM | POA: Insufficient documentation

## 2015-09-04 DIAGNOSIS — E1140 Type 2 diabetes mellitus with diabetic neuropathy, unspecified: Secondary | ICD-10-CM | POA: Insufficient documentation

## 2015-09-04 DIAGNOSIS — K5730 Diverticulosis of large intestine without perforation or abscess without bleeding: Secondary | ICD-10-CM | POA: Insufficient documentation

## 2015-09-16 DIAGNOSIS — M546 Pain in thoracic spine: Secondary | ICD-10-CM | POA: Insufficient documentation

## 2015-09-16 DIAGNOSIS — D1722 Benign lipomatous neoplasm of skin and subcutaneous tissue of left arm: Secondary | ICD-10-CM | POA: Insufficient documentation

## 2015-09-16 DIAGNOSIS — G8929 Other chronic pain: Secondary | ICD-10-CM | POA: Insufficient documentation

## 2015-10-11 DIAGNOSIS — Z86010 Personal history of colonic polyps: Secondary | ICD-10-CM | POA: Insufficient documentation

## 2015-10-11 DIAGNOSIS — Z6841 Body Mass Index (BMI) 40.0 and over, adult: Secondary | ICD-10-CM | POA: Insufficient documentation

## 2015-10-12 DIAGNOSIS — Z8371 Family history of colonic polyps: Secondary | ICD-10-CM | POA: Insufficient documentation

## 2015-10-16 ENCOUNTER — Ambulatory Visit: Payer: Medicare Other | Admitting: Internal Medicine

## 2015-10-18 DIAGNOSIS — M5414 Radiculopathy, thoracic region: Secondary | ICD-10-CM | POA: Insufficient documentation

## 2015-10-19 ENCOUNTER — Encounter: Payer: Self-pay | Admitting: Internal Medicine

## 2015-10-19 ENCOUNTER — Ambulatory Visit (INDEPENDENT_AMBULATORY_CARE_PROVIDER_SITE_OTHER): Payer: Medicare Other | Admitting: Internal Medicine

## 2015-10-19 ENCOUNTER — Ambulatory Visit (INDEPENDENT_AMBULATORY_CARE_PROVIDER_SITE_OTHER)
Admission: RE | Admit: 2015-10-19 | Discharge: 2015-10-19 | Disposition: A | Discharge status: Home / Self Care | Payer: Medicare Other | Source: Ambulatory Visit | Attending: Internal Medicine | Admitting: Internal Medicine

## 2015-10-19 VITALS — BP 126/72 | HR 90 | Ht 68.5 in | Wt 273.8 lb

## 2015-10-19 DIAGNOSIS — G4733 Obstructive sleep apnea (adult) (pediatric): Secondary | ICD-10-CM | POA: Diagnosis not present

## 2015-10-19 DIAGNOSIS — Z720 Tobacco use: Secondary | ICD-10-CM | POA: Diagnosis not present

## 2015-10-19 DIAGNOSIS — J449 Chronic obstructive pulmonary disease, unspecified: Secondary | ICD-10-CM | POA: Diagnosis not present

## 2015-10-19 MED ORDER — ALBUTEROL SULFATE (5 MG/ML) 0.5% IN NEBU: 2.5000 mg | INHALATION_SOLUTION | RESPIRATORY_TRACT | Status: DC | PRN

## 2015-10-19 NOTE — Patient Instructions (Signed)
Handicapped Parking  Order- CXR    Dx COPD mixed type  Order- office spirometry    Order- ok to continue DME [facility] CPAP 17, mask of choice, humidifier, supplies, AirView                        Evaluate eligibility for replacement CPAP machine  Order- Printed script refilling neb solution  To get through Medicare/ [facility]  Please keep trying to stop smoking, before it stops you.

## 2015-10-19 NOTE — Progress Notes (Signed)
60 yoF former smoker, followed by Dr Joya Gaskins for COPD w/ hx pneumonia. Has hx OSA and wants to restart CPAP.  NPSG 01/04/03 [place] Severe OSA, RDI.( [facility] )32.3/hr. [place] 11/24, weight 270 lbs. Dr. Joya Gaskins had seen her once for diagnoses of COPD. She was hospitalized with the flu syndrome and pneumonia and since then has been waking, gasping at night and short of breath with exertion. The pneumonia cleared by chest x-ray but her dyspnea has not improved. She is in a separate bedroom from her husband, who uses CPAP, so he does not report problems during her sleep. Trazodone is being used as a sleep medication. She has a previous diagnosis of delayed pressure urticaria. CPAP mask and straps were causing this to flare. She dropped off of CPAP several years ago. Has tried oral appliances without being able to see satisfactory improvement. Recently she has tried wearing her husband's CPAP to get an extra hour of sleep in the morning while he is getting up and going for the day. Overnight oximetry demonstrated frequent desaturation. Surgical history includes tonsils, sinus surgery twice by Dr. Ernesto Rutherford. Bedtime between midnight and 2 AM, estimating 30-60 minutes sleep latency. Wakes 6 or more times per night and gets up between noon and 2 PM. She is not employed. 10 pound weight gain in the last year.  11/28/11- 60 yoF former smoker, followed by Dr Annamaria Boots for OSA and by Dr Joya Gaskins for COPD w/ hx pneumonia. Wears CPAP every night approx 8-10 hours; pressure could be more at times per patient. Sleeping better. She wants help stopping tobacco.  Patches caused skin breakout. Still at 1-1/2 packs per day. Chest x-ray 08/10/2011-stable bronchitis changes.  01/28/12- 60 yoF former smoker, followed by Dr Annamaria Boots for OSA and by Dr Joya Gaskins for COPD w/ hx pneumonia. PCP Dr Jeralene Huff Wears CPAP every night for approximately 8-12 hours; pressure working well- however at times feels like the pressure could be increased. SOB increased  (gotten worse since heat started), denies any wheezing, cough, or congestion Hospitalized in January for pneumonia/flu. She is following with me now for her COPD as well as her sleep apnea, because she didn't want to deal with 2 different doctors. She continues CPAP 15/Advanced, using a nasal mask. She describes very good compliance and control but she would like to try a little higher pressure for comfort. Her COPD assessment test (CAT) score 22/40 Denies significant breathing problems in the spring. Some increased shortness of breath now with hot humid weather. She still smokes 1-1/2 packs per day despite counseling efforts. Daily Zyrtec. Now diagnosed with diabetes.  04/16/12- 60 yoF  1.5 ppd smoker, followed by Dr Annamaria Boots for OSA and by Dr Joya Gaskins for COPD w/ hx pneumonia.   PCP Dr Jeralene Huff Pt states wears CPAP 17/ Advanced approx 8-12 hours night 7 days a week. Has some issues with mask but will speak with DME company. Likes this pressure. Has had flu vaccine. Primary physician treated prednisone and Z-Pak for an acute bronchitis and she has had chest x-ray CXR 01/31/12 IMPRESSION:  No acute disease.  Original Report Authenticated By: Larey Seat, M.D.   [birth year]- 60 yoF  1.5 ppd smoker, followed for  OSA and COPD w/ hx pneumonia. FOLLOWS FOR: wears CPAP 17/ Advanced every night for about 10 hours; pressure working well for patient.; Pt would like to have her O2 checked while walking-feels like she gives out of air.  No effort to stop smoking. Dry cough and dry mouth despite  Biotene. Says she is doing "fine" on CPAP. PFT 08/26/2011-restriction. FVC 68%, FEV1 62%, FEV1/FVC 0.93. CXR 04/24/12 IMPRESSION:  Slightly displaced fracture of the anterolateral aspect of the left  eighth rib. No other rib fractures evident. There is minimal left  basilar atelectasis. No pleural effusion or pneumothorax is  evident. Cardiac silhouette is borderline size.  Original Report Authenticated By: Delane Ginger, M.D.  02/23/13- 60 yoF  1.5 ppd smoker, followed for  OSA and COPD w/ hx pneumonia. FOLLOWS FOR: pt reports wearing CPAP 17/ Advanced everynight x12 hrs per night, tolerating pressure settings ok-- no supplies needed at this time Still smoking-we discussed this again.. Dry mouth, never used humidifier, but recognizes effect of Ditropan. Says she had a CT scan by her PCP "nodules" we don't have that report in our system and she will try to get it for [place].  02/24/14- 60 yoF  1.5 ppd smoker, followed for  OSA and COPD w/ hx pneumonia, tobacco abuse. FOLLOWS FOR: wears CPAP 17/ Advanced every night-does have some nights of insomnia. DME is [facility]. No effort to stop smoking despite counseling. Has a cat. Admits morning cough, anxiety. She feels she is doing well with CPAP with no concerns or changes requested. She  does note that she originally had oxygen through her CPAP, but no longer. Questions if she should. CPAP download shows good control during autotitration with a pressure of 14 being appropriate. Good compliance. She definitely feels it helps her and wishes to continue. CXR 03/18/13 IMPRESSION:  Lingular scarring. No acute findings.  Original Report Authenticated By: Rolm Baptise, M.D. Office Spirometry 02/24/2014-mild obstruction and restriction of exhaled volume. FVC 2.64/72%, FEV1 1.80/63%, FEV1/FVC 0.68, FEF 25-75% 0.92/35%.  09/05/14- 15 yoF  1.5 ppd smoker, followed for  OSA and COPD w/ hx pneumonia, tobacco abuse. FOLLOWS FOR: Pt wears cpap 17/ Advanced nightly, she could not give # hours due to she is constantly up and down. She has sob with exertion. Breathing is comfortable but she continues to smoke-discussed again Not using oxygen because she says her cat chews through the plastic line and it is inconvenient because she is up frequently for nocturia. ONOX on room air with CPAP 03/03/14- did not meet qualifying levels Asks muscle relaxant for pulled back muscle CXR  01/28/14 IMPRESSION: No active cardiopulmonary disease. Electronically Signed  By: Marin Olp M.D.  On: 02/25/2014 08:06  10/19/2015-60 year old female smoker followed for OSA, COPD with history pneumonia, tobacco abuse, Chronic restrictive lung disease, CPAP 17, O2 2 L sleep/Advanced FOLLOWS FOR: Pt wears CPAP every night and for naps. DL attached Unfortunately continues to smoke against repeated advice and discussion Not using oxygen anymore "I don't need it", but admits dyspnea on exertion walking. Asks handicapped parking. Really appreciates CPAP benefit. Missed it twice when machine wasn't working right and slept much worse. Download good. Office Spirometry 10/19/2015-mild obstruction with mild restriction of exhaled volume. FVC 2.62/73%, FEV1 1.92/69%, ratio 0.73, FEF 25-75 percent 1.35/54%  ROS-see HPI Constitutional:   No-   weight loss, night sweats, fevers, chills, fatigue, lassitude. HEENT:   No-  headaches, difficulty swallowing, tooth/dental problems, sore throat,       No-  sneezing, itching, ear ache, nasal congestion, post nasal drip,  CV:  No-   chest pain, orthopnea, PND, swelling in lower extremities, anasarca, dizziness, palpitations Resp: +  shortness of breath with exertion or at rest.              No-   productive cough,  +  non-productive cough,  No- coughing up of blood.              No-   change in color of mucus.  No- wheezing.   Skin: No-   rash or lesions. GI:  No-   heartburn, indigestion, abdominal pain, nausea, vomiting, GU: MS:  No-   joint pain or swelling. + Pulled muscle and back Neuro-     nothing unusual Psych:  No- change in mood or affect. No depression or anxiety.  No memory loss.  OBJ- Physical Exam General- Alert, Oriented, Affect-appropriate, Distress- none acute, obese, + odor tobacco Skin- rash-none, lesions- none, excoriation- none.  Lymphadenopathy- none Head- atraumatic            Eyes- Gross vision intact, PERRLA, conjunctivae and  secretions clear, strabismus            Ears- Hearing, canals-normal            Nose- Clear, no-Septal dev, mucus, polyps, erosion, perforation             Throat- Mallampati II-III , mucosa clear , drainage- none, tonsils- atrophic.+ Mild hoarseness Neck- flexible , trachea midline, no stridor , thyroid nl, carotid no bruit Chest - symmetrical excursion , unlabored           Heart/CV- RRR , no murmur , no gallop  , no rub, nl s1 s2                           - JVD- none , edema R>L trace, stasis changes- none, varices- none           Lung- + diminished but clear, wheeze- none, cough+ , dullness-none, rub- none           Chest wall-  Abd-  Br/ Gen/ Rectal- Not done, not indicated Extrem- cyanosis- none, clubbing, none, atrophy- none, strength- nl Neuro- +resting tremor/ head bob

## 2015-10-20 ENCOUNTER — Telehealth: Payer: Self-pay | Admitting: Internal Medicine

## 2015-10-20 MED ORDER — ALBUTEROL SULFATE (2.5 MG/3ML) 0.083% IN NEBU: INHALATION_SOLUTION | RESPIRATORY_TRACT | Status: DC

## 2015-10-20 NOTE — Telephone Encounter (Signed)
Spoke with Helene Kelp @ [facility]. She states that they way albuterol neb rx is written, the 0.5% alone is not enough to nebulize and that it needs to be mixed with saline for proper use. Please advise if pt should use the 0.5% with saline or if rx should be for the 0.083% so that it does not have to be mixed.   CY Please advise.    Allergies  Allergen Reactions  . Nicotine Other (See Comments)    PATCH- gives blisters and rash  . Ppd [Tuberculin Purified Protein Derivative]     Pt states she has hives and itching with this test  . Sulfa Antibiotics Other (See Comments)    Dizzines, nausea  . Penicillins Rash   Current Outpatient Prescriptions on File Prior to Visit  Medication Sig Dispense Refill  . albuterol (PROVENTIL HFA;VENTOLIN HFA) 108 (90 BASE) MCG/ACT inhaler Inhale 2 puffs into the lungs every 6 (six) hours as needed for wheezing or shortness of breath. wheezing 1 Inhaler prn  . albuterol (PROVENTIL) (5 MG/ML) 0.5% nebulizer solution Take 0.5 mLs (2.5 mg total) by nebulization every 4 (four) hours as needed. 75 mL 12  . amLODipine (NORVASC) 10 MG tablet Take 10 mg by mouth daily.    . cetirizine (ZYRTEC) 10 MG tablet Take 10 mg by mouth daily.    . DULoxetine (CYMBALTA) 60 MG capsule Take 60 mg by mouth daily.    Marland Kitchen JANUVIA 100 MG tablet Take 100 mg by mouth daily.  1  . JANUVIA 50 MG tablet Take 1 tablet by mouth daily.    Marland Kitchen labetalol (NORMODYNE) 200 MG tablet Take 1 tablet by mouth 2 (two) times daily.    . lansoprazole (PREVACID) 30 MG capsule Take 30 mg by mouth every other day.    . metFORMIN (GLUCOPHAGE) 1000 MG tablet Take 1,000 mg by mouth 2 (two) times daily with a meal.    . oxybutynin (DITROPAN-XL) 10 MG 24 hr tablet Take 10 mg by mouth 2 (two) times daily.    . pravastatin (PRAVACHOL) 40 MG tablet Take 40 mg by mouth daily.    . pregabalin (LYRICA) 100 MG capsule Take 100 mg by mouth 2 (two) times daily.    . traZODone (DESYREL) 100 MG tablet Take 100-200 mg  by mouth at bedtime.    . triamterene-hydrochlorothiazide (MAXZIDE-25) 37.5-25 MG per tablet Take 1 tablet by mouth daily.    . valsartan (DIOVAN) 320 MG tablet Take 320 mg by mouth daily.  5   No current facility-administered medications on file prior to visit.   Patient Instructions     Handicapped Parking  Order- CXR Dx COPD mixed type  Order- office spirometry   Order- ok to continue DME Advanced CPAP 17, mask of choice, humidifier, supplies, AirView  Evaluate eligibility for replacement CPAP machine  Order- Printed script refilling neb solution To get through Medicare/ Advanced  Please keep trying to stop smoking, before it stops you.

## 2015-10-20 NOTE — Telephone Encounter (Signed)
Spoke with Helene Kelp and notified of recs per CDY  Order was changed  We had to make it say every 6 hours AND as needed  I have LMTCB for the pt so we can make her aware she should still take med only as needed

## 2015-10-20 NOTE — Telephone Encounter (Signed)
Let's change order to Albuterol 0.083% neb solution, 150 ml,  Ref x 12,    1 neb every 6 hours if needed

## 2015-10-20 NOTE — Assessment & Plan Note (Addendum)
Mild obstructive airways disease is better than might be expected for her smoking history. She notices exertional dyspnea as expected without acute change and with little cough or wheeze. Plan-smoking cessation emphasized, chest x-ray, handicapped parking, continue present meds.

## 2015-10-20 NOTE — Assessment & Plan Note (Signed)
Good compliance and control. Pressure is comfortable and no changes are needed. We talked about eligibility for new machine.

## 2015-10-20 NOTE — Assessment & Plan Note (Signed)
I talked with her firmly about this today. She really needs to be willing to make an effort to stop smoking. Available support reviewed.

## 2015-10-24 NOTE — Telephone Encounter (Signed)
Left message for patient to call back  

## 2015-10-25 NOTE — Telephone Encounter (Signed)
lmtcb x2 for pt. 

## 2015-10-26 NOTE — Telephone Encounter (Signed)
lmtcb for pt.  

## 2015-10-31 ENCOUNTER — Encounter: Payer: Self-pay | Admitting: Internal Medicine

## 2015-10-31 ENCOUNTER — Telehealth: Payer: Self-pay | Admitting: Internal Medicine

## 2015-10-31 NOTE — Telephone Encounter (Signed)
Result Notes     Notes Recorded by Randa Spike, CMA on 10/27/2015 at 9:30 AM lmtcb x3 for pt. ------  Notes Recorded by Randa Spike, CMA on 10/26/2015 at 10:55 AM lmtcb x2 for pt. ------  Notes Recorded by Lorane Gell, CMA on 10/20/2015 at 3:29 PM LMTCB ------  Notes Recorded by Deneise Lever, MD on 10/20/2015 at 12:45 PM CXR-there are some markings in the left lung which might be a pneumonia. I think I see them on an old chest x-ray which would mean that they are just scarring. Call for antibiotic if needed because of fever or infected looking sputum.   lmtcb x1 for pt. Results are in to much detail to leave a voicemail.

## 2015-11-01 NOTE — Telephone Encounter (Signed)
Spoke with patient - aware of results. Nothing further needed.

## 2015-11-01 NOTE — Telephone Encounter (Signed)
lmtcb

## 2015-11-01 NOTE — Telephone Encounter (Signed)
IN LOBBY to get cxr results

## 2016-01-14 ENCOUNTER — Emergency Department (HOSPITAL_COMMUNITY)
Admission: EM | Admit: 2016-01-14 | Discharge: 2016-01-14 | Disposition: A | Discharge status: Home / Self Care | Payer: Medicare Other | Attending: Emergency Medicine | Admitting: Emergency Medicine

## 2016-01-14 ENCOUNTER — Encounter (HOSPITAL_COMMUNITY): Payer: Self-pay | Admitting: Emergency Medicine

## 2016-01-14 DIAGNOSIS — F329 Major depressive disorder, single episode, unspecified: Secondary | ICD-10-CM | POA: Insufficient documentation

## 2016-01-14 DIAGNOSIS — Z79899 Other long term (current) drug therapy: Secondary | ICD-10-CM | POA: Insufficient documentation

## 2016-01-14 DIAGNOSIS — Z7984 Long term (current) use of oral hypoglycemic drugs: Secondary | ICD-10-CM | POA: Insufficient documentation

## 2016-01-14 DIAGNOSIS — I10 Essential (primary) hypertension: Secondary | ICD-10-CM | POA: Diagnosis not present

## 2016-01-14 DIAGNOSIS — F17210 Nicotine dependence, cigarettes, uncomplicated: Secondary | ICD-10-CM | POA: Insufficient documentation

## 2016-01-14 DIAGNOSIS — E785 Hyperlipidemia, unspecified: Secondary | ICD-10-CM | POA: Insufficient documentation

## 2016-01-14 DIAGNOSIS — K5900 Constipation, unspecified: Secondary | ICD-10-CM | POA: Diagnosis not present

## 2016-01-14 DIAGNOSIS — M542 Cervicalgia: Secondary | ICD-10-CM | POA: Insufficient documentation

## 2016-01-14 DIAGNOSIS — J449 Chronic obstructive pulmonary disease, unspecified: Secondary | ICD-10-CM | POA: Diagnosis not present

## 2016-01-14 DIAGNOSIS — R1084 Generalized abdominal pain: Secondary | ICD-10-CM | POA: Diagnosis present

## 2016-01-14 MED ORDER — POLYETHYLENE GLYCOL 3350 17 G PO PACK: 17.0000 g | PACK | Freq: Two times a day (BID) | ORAL | Status: DC

## 2016-01-14 NOTE — ED Notes (Addendum)
Pt c/o abdominal cramping and constipation after taking oxycodone for a pinched nerve. Pt states it has "been 5 or 6 days" since last BM.

## 2016-01-14 NOTE — ED Provider Notes (Signed)
CSN: [id number]     Arrival date & time 01/14/16  1619 History   First MD Initiated Contact with Patient 01/14/16 1728     Chief Complaint  Patient presents with  . Constipation     (Consider location/radiation/quality/duration/timing/severity/associated sxs/prior Treatment) HPI Comments: 60 year old female presents for constipation. The patient reports she's been taking OxyContin at home for a pinched nerve in her neck. She says that over the last 6-7 days she has felt like she needs to have a bowel movement but has been unable to. She says that when she gets the feeling that she needs to pass stool her abdomen gets very crampy and painful. She denies vomiting or nausea. She believes she is passing gas. She says she has always had issues with slow moving bowels. Denies any significant abdominal surgeries other than a cholecystectomy.  Patient is a 60 y.o. female presenting with constipation.  Constipation Associated symptoms: abdominal pain (intermittent cramping)   Associated symptoms: no back pain, no dysuria, no nausea and no vomiting     Past Medical History  Diagnosis Date  . Restrictive lung disease   . Sleep apnea   . Hypertension   . Depression   . Anxiety   . COPD (chronic obstructive pulmonary disease) ([place])   . Hyperlipidemia    Past Surgical History  Procedure Laterality Date  . Abdominal hysterectomy    . Cholecystectomy    . Nasal sinus surgery    . Tonsillectomy     Family History  Problem Relation Age of Onset  . Lung cancer Father   . COPD Father   . Heart attack Mother   . Hypertension Mother   . Hypertension Father   . Heart attack Father    Social History  Substance Use Topics  . Smoking status: Current Every Day Smoker -- 1.50 packs/day for 46 years    Types: Cigarettes  . Smokeless tobacco: Never Used  . Alcohol Use: No   OB History    No data available     Review of Systems  Constitutional: Negative for unexpected weight change.  HENT:  Negative for congestion and postnasal drip.   Eyes: Negative for visual disturbance.  Respiratory: Negative for cough, chest tightness and shortness of breath.   Cardiovascular: Negative for chest pain and palpitations.  Gastrointestinal: Positive for abdominal pain (intermittent cramping) and constipation. Negative for nausea, vomiting and blood in stool.  Genitourinary: Negative for dysuria, urgency, frequency and hematuria.  Musculoskeletal: Positive for neck pain (chronic from reported pinched nerve). Negative for myalgias and back pain.  Skin: Negative for rash.  Neurological: Negative for dizziness, weakness and headaches.  Hematological: Does not bruise/bleed easily.      Allergies  Nicotine; Ppd; Sulfa antibiotics; and Penicillins  Home Medications   Prior to Admission medications   Medication Sig Start Date End Date Taking? Authorizing Provider  albuterol (PROVENTIL HFA;VENTOLIN HFA) 108 (90 BASE) MCG/ACT inhaler Inhale 2 puffs into the lungs every 6 (six) hours as needed for wheezing or shortness of breath. wheezing 02/24/14   Deneise Lever, MD  albuterol (PROVENTIL) (2.5 MG/3ML) 0.083% nebulizer solution 1 every 6 hours AND as needed 10/20/15   Deneise Lever, MD  amLODipine (NORVASC) 10 MG tablet Take 10 mg by mouth daily.    Historical Provider, MD  cetirizine (ZYRTEC) 10 MG tablet Take 10 mg by mouth daily.    Historical Provider, MD  DULoxetine (CYMBALTA) 60 MG capsule Take 60 mg by mouth daily.  Historical Provider, MD  JANUVIA 100 MG tablet Take 100 mg by mouth daily. 08/15/14   Historical Provider, MD  JANUVIA 50 MG tablet Take 1 tablet by mouth daily. 09/07/12   Historical Provider, MD  labetalol (NORMODYNE) 200 MG tablet Take 1 tablet by mouth 2 (two) times daily. 02/11/14   Historical Provider, MD  lansoprazole (PREVACID) 30 MG capsule Take 30 mg by mouth every other day.    Historical Provider, MD  metFORMIN (GLUCOPHAGE) 1000 MG tablet Take 1,000 mg by mouth 2  (two) times daily with a meal.    Historical Provider, MD  oxybutynin (DITROPAN-XL) 10 MG 24 hr tablet Take 10 mg by mouth 2 (two) times daily.    Historical Provider, MD  pravastatin (PRAVACHOL) 40 MG tablet Take 40 mg by mouth daily.    Historical Provider, MD  pregabalin (LYRICA) 100 MG capsule Take 100 mg by mouth 2 (two) times daily. 10/18/15   Historical Provider, MD  traZODone (DESYREL) 100 MG tablet Take 100-200 mg by mouth at bedtime.    Historical Provider, MD  triamterene-hydrochlorothiazide (MAXZIDE-25) 37.5-25 MG per tablet Take 1 tablet by mouth daily. 01/21/14   Historical Provider, MD  valsartan (DIOVAN) 320 MG tablet Take 320 mg by mouth daily. 08/15/14   Historical Provider, MD   BP 146/70 mmHg  Pulse 81  Temp(Src) 98.2 F (36.8 C) (Oral)  Resp 22  SpO2 96% Physical Exam  Constitutional: She is oriented to person, place, and time. She appears well-developed and well-nourished. No distress.  HENT:  Head: Normocephalic and atraumatic.  Right Ear: External ear normal.  Left Ear: External ear normal.  Nose: Nose normal.  Mouth/Throat: Oropharynx is clear and moist. No oropharyngeal exudate.  Eyes: EOM are normal. Pupils are equal, round, and reactive to light.  Neck: Normal range of motion. Neck supple.  Cardiovascular: Normal rate, regular rhythm, normal heart sounds and intact distal pulses.   No murmur heard. Pulmonary/Chest: Effort normal. No respiratory distress. She has no wheezes. She has no rales.  Abdominal: Soft. She exhibits distension. She exhibits no mass. There is no tenderness. There is no rebound and no guarding.  Genitourinary:  Large amount of stool in the rectum.  Able to remove small amount of brown, hard stool.  Musculoskeletal: Normal range of motion. She exhibits no edema or tenderness.  Neurological: She is alert and oriented to person, place, and time.  Skin: Skin is warm and dry. No rash noted. She is not diaphoretic.  Vitals reviewed.   ED  Course  Procedures (including critical care time) Labs Review Labs Reviewed - No data to display  Imaging Review No results found. I have personally reviewed and evaluated these images and lab results as part of my medical decision-making.   EKG Interpretation None      MDM  Patient seen and evaluated in stable condition. On rectal examination large amount of stool in the rectal vault. Small amount was able to be removed. Patient was given a soapsuds enema with good response. She passed a large bowel movement and felt significantly improved. She was discharged home in stable condition with prescription for MiraLAX and instruction of follow-up with her PCP. Final diagnoses:  None    1. Constipation    Harvel Quale, MD 01/15/16 [phone]

## 2016-01-14 NOTE — Discharge Instructions (Signed)
You were seen and evaluated today for constipation. Likely this is related to your pain medication. Please take MiraLAX at home to help with your constipation.  Constipation, Adult Constipation is when a person:  Poops (has a bowel movement) less than 3 times a week.  Has a hard time pooping.  Has poop that is dry, hard, or bigger than normal. HOME CARE   Eat foods with a lot of fiber in them. This includes fruits, vegetables, beans, and whole grains such as brown rice.  Avoid fatty foods and foods with a lot of sugar. This includes french fries, hamburgers, cookies, candy, and soda.  If you are not getting enough fiber from food, take products with added fiber in them (supplements).  Drink enough fluid to keep your pee (urine) clear or pale yellow.  Exercise on a regular basis, or as told by your doctor.  Go to the restroom when you feel like you need to poop. Do not hold it.  Only take medicine as told by your doctor. Do not take medicines that help you poop (laxatives) without talking to your doctor first. GET HELP RIGHT AWAY IF:   You have bright red blood in your poop (stool).  Your constipation lasts more than 4 days or gets worse.  You have belly (abdominal) or butt (rectal) pain.  You have thin poop (as thin as a pencil).  You lose weight, and it cannot be explained. MAKE SURE YOU:   Understand these instructions.  Will watch your condition.  Will get help right away if you are not doing well or get worse.   This information is not intended to replace advice given to you by your health care provider. Make sure you discuss any questions you have with your health care provider.   Document Released: 01/01/2008 Document Revised: 08/05/2014 Document Reviewed: 04/26/2013 Elsevier Interactive Patient Education [employer].

## 2016-03-05 DIAGNOSIS — R600 Localized edema: Secondary | ICD-10-CM | POA: Insufficient documentation

## 2016-08-08 DIAGNOSIS — E559 Vitamin D deficiency, unspecified: Secondary | ICD-10-CM | POA: Insufficient documentation

## 2016-12-30 ENCOUNTER — Ambulatory Visit (INDEPENDENT_AMBULATORY_CARE_PROVIDER_SITE_OTHER): Payer: Medicare Other | Admitting: Internal Medicine

## 2016-12-30 ENCOUNTER — Encounter: Payer: Self-pay | Admitting: Internal Medicine

## 2016-12-30 ENCOUNTER — Ambulatory Visit (INDEPENDENT_AMBULATORY_CARE_PROVIDER_SITE_OTHER)
Admission: RE | Admit: 2016-12-30 | Discharge: 2016-12-30 | Disposition: A | Discharge status: Home / Self Care | Payer: Medicare Other | Source: Ambulatory Visit | Attending: Internal Medicine | Admitting: Internal Medicine

## 2016-12-30 VITALS — BP 118/78 | HR 80 | Ht 68.5 in | Wt 294.4 lb

## 2016-12-30 DIAGNOSIS — Z720 Tobacco use: Secondary | ICD-10-CM

## 2016-12-30 DIAGNOSIS — J449 Chronic obstructive pulmonary disease, unspecified: Secondary | ICD-10-CM

## 2016-12-30 DIAGNOSIS — G4733 Obstructive sleep apnea (adult) (pediatric): Secondary | ICD-10-CM | POA: Diagnosis not present

## 2016-12-30 NOTE — Patient Instructions (Signed)
Order- CXR     Dx Tobacco user  Order- DME [facility]- Please fix or replace full SD card and provide current download. We can continue CPAP 17, mask of choice, humidifier, supplies,        Dx OSA   Try using the Nicoderm CQ patches that yor skin tolerates, and maybe sugarless gum and walking for exercise to deal with any extra tension while your body gets used to gradually lower amounts of nicotine.  Please call as needed

## 2016-12-30 NOTE — Assessment & Plan Note (Signed)
I suggested she go back to the nicotine patch which were skin tolerates. Supplement when needed with sugarless gum. I don't advise adding additional nicotine sources as discussed.

## 2016-12-30 NOTE — Assessment & Plan Note (Signed)
Compliance and control the been good. Plan-DME to replace her download card which is full.

## 2016-12-30 NOTE — Progress Notes (Signed)
HPI  female smoker followed for OSA, COPD with history pneumonia, tobacco abuse, Chronic restrictive lung disease NPSG 01/04/03 [place] Severe OSA, RDI.( AHI )32.3/hr. [place] 11/24, weight 270 lbs. PFT 08/26/2011-restriction. FVC 68%, FEV1 62%, FEV1/FVC 0.93. Office Spirometry 02/24/2014-mild obstruction and restriction of exhaled volume. FVC 2.64/72%, FEV1 1.80/63%, FEV1/FVC 0.68, FEF 25-75% 0.92/35% Office Spirometry 10/19/2015-mild obstruction with mild restriction of exhaled volume. FVC 2.62/73%, FEV1 1.92/69%, ratio 0.73, FEF 25-75 percent 1.35/54% ------------------------------------------------------------------------------------------  10/19/2015-61 year old female smoker followed for OSA, COPD with history pneumonia, tobacco abuse, Chronic restrictive lung disease, CPAP 17, O2 2 L sleep/Advanced FOLLOWS FOR: Pt wears CPAP every night and for naps. DL attached Unfortunately continues to smoke against repeated advice and discussion Not using oxygen anymore "I don't need it", but admits dyspnea on exertion walking. Asks handicapped parking. Really appreciates CPAP benefit. Missed it twice when machine wasn't working right and slept much worse. Download good. Office Spirometry 10/19/2015-mild obstruction with mild restriction of exhaled volume. FVC 2.62/73%, FEV1 1.92/69%, ratio 0.73, FEF 25-75 percent 1.35/54%  12/30/16- 8year-old female smoker followed for OSA, COPD with history pneumonia, tobacco abuse, Chronic restrictive lung disease, CPAP 17, O2 2 L sleep/Advanced FOLLOWS FOR: Wears CPAP nightly. Denies problems with mask/pressure. DME: [facility];  Pt states that she has noticed her SOB worsening - pt still smoking. States that she knows the humid weather and smoking does not help her breathing.  She says nicotine patch blistered her skin. A different brand is better tolerated but not as strong. We discussed options and emphasized the importance of smoking cessation. Little cough and no  chest pain or discolored sputum. Download 90% 4 hour compliance, AHI 0.5/hour indicating good control. She is committed to CPAP, using it even when she naps. CXR 10/20/15 IMPRESSION: 1.  Lingular infiltrate consistent with pneumonia. 2. Bibasilar atelectasis. 3.  Cardiomegaly with normal pulmonary vascularity .   ROS-see HPI Constitutional:   No-   weight loss, night sweats, fevers, chills, fatigue, lassitude. HEENT:   No-  headaches, difficulty swallowing, tooth/dental problems, sore throat,       No-  sneezing, itching, ear ache, nasal congestion, post nasal drip,  CV:  No-   chest pain, orthopnea, PND, swelling in lower extremities, anasarca, dizziness, palpitations Resp: +  shortness of breath with exertion or at rest.              No-   productive cough,  +non-productive cough,  No- coughing up of blood.              No-   change in color of mucus.  No- wheezing.   Skin: No-   rash or lesions. GI:  No-   heartburn, indigestion, abdominal pain, nausea, vomiting, GU: MS:  No-   joint pain or swelling. + Pulled muscle and back Neuro-     nothing unusual Psych:  No- change in mood or affect. No depression or anxiety.  No memory loss.  OBJ- Physical Exam General- Alert, Oriented, Affect-appropriate, Distress- none acute, obese, + odor tobacco Skin- rash-none, lesions- none, excoriation- none.  Lymphadenopathy- none Head- atraumatic            Eyes- Gross vision intact, PERRLA, conjunctivae and secretions clear, strabismus            Ears- Hearing, canals-normal            Nose- Clear, no-Septal dev, mucus, polyps, erosion, perforation             Throat- Mallampati II-III ,  mucosa clear , drainage- none, tonsils- atrophic.+ Mild hoarseness Neck- flexible , trachea midline, no stridor , thyroid nl, carotid no bruit Chest - symmetrical excursion , unlabored           Heart/CV- RRR , no murmur , no gallop  , no rub, nl s1 s2                           - JVD- none , edema R>L trace,  stasis changes- none, varices- none           Lung- + diminished but clear, wheeze- none, cough-none, dullness-none, rub- none           Chest wall-  Abd-  Br/ Gen/ Rectal- Not done, not indicated Extrem- cyanosis- none, clubbing, none, atrophy- none, strength- nl Neuro- +resting tremor/ head bob persistent, probably not worse than last visit

## 2016-12-30 NOTE — Assessment & Plan Note (Signed)
I don't think her status has changed much. She is overweight and gets little exercise so there is a component of deconditioning contributing to dyspnea. Plan-CXR

## 2017-01-06 NOTE — Progress Notes (Signed)
LMTCB

## 2017-01-07 ENCOUNTER — Telehealth: Payer: Self-pay | Admitting: Internal Medicine

## 2017-01-07 DIAGNOSIS — R918 Other nonspecific abnormal finding of lung field: Secondary | ICD-10-CM

## 2017-01-07 NOTE — Telephone Encounter (Signed)
Notes recorded by Jannette Spanner, CMA on 01/07/2017 at 4:09 PM EDT ATC pt. Left message for pt to call back.  ------  Notes recorded by Rosana Berger, CMA on 01/06/2017 at 11:07 AM EDT LMTCB ------  Notes recorded by Lorane Gell, CMA on 01/03/2017 at 10:14 AM EDT LMTCB ------  Notes recorded by Deneise Lever, MD on 12/31/2016 at 11:37 AM EDT CXR- There is a persistent density in the lower left lung that we need to look at more closely.   Please order CT chest with contrast (BMET if needed for renal function)  Dx left lung mass     LMTCB

## 2017-01-08 ENCOUNTER — Other Ambulatory Visit: Payer: Self-pay

## 2017-01-08 DIAGNOSIS — R918 Other nonspecific abnormal finding of lung field: Secondary | ICD-10-CM

## 2017-01-08 NOTE — Telephone Encounter (Signed)
Pt returning call again for results [phone]

## 2017-01-08 NOTE — Telephone Encounter (Signed)
Spoke with patient regarding results. She verbalized understanding. Will place order for CT scan.

## 2017-01-08 NOTE — Telephone Encounter (Signed)
LMOMTCB for the pt to call back.

## 2017-01-13 ENCOUNTER — Other Ambulatory Visit: Payer: Medicare Other

## 2017-01-13 DIAGNOSIS — R918 Other nonspecific abnormal finding of lung field: Secondary | ICD-10-CM

## 2017-01-14 LAB — BASIC METABOLIC PANEL
BUN: 15 mg/dL (ref 6–23)
CALCIUM: 9.4 mg/dL (ref 8.4–10.5)
CO2: 26 meq/L (ref 19–32)
CREATININE: 0.96 mg/dL (ref 0.40–1.20)
Chloride: 103 mEq/L (ref 96–112)
GFR: 62.3 mL/min (ref >60.00)
Glucose, Bld: 203 mg/dL — ABNORMAL HIGH (ref 70–99)
Potassium: 3.4 mEq/L — ABNORMAL LOW (ref 3.5–5.1)
SODIUM: 140 meq/L (ref 135–145)

## 2017-01-15 ENCOUNTER — Ambulatory Visit (INDEPENDENT_AMBULATORY_CARE_PROVIDER_SITE_OTHER)
Admission: RE | Admit: 2017-01-15 | Discharge: 2017-01-15 | Disposition: A | Discharge status: Home / Self Care | Payer: Medicare Other | Source: Ambulatory Visit | Attending: Internal Medicine | Admitting: Internal Medicine

## 2017-01-15 DIAGNOSIS — R918 Other nonspecific abnormal finding of lung field: Secondary | ICD-10-CM

## 2017-01-15 MED ORDER — IOPAMIDOL (ISOVUE-300) INJECTION 61%
80.0000 mL | Freq: Once | INTRAVENOUS | Status: AC | PRN
  Administered 2017-01-15: 80 mL via INTRAVENOUS

## 2017-01-16 ENCOUNTER — Telehealth: Payer: Self-pay | Admitting: Internal Medicine

## 2017-01-16 DIAGNOSIS — R911 Solitary pulmonary nodule: Secondary | ICD-10-CM

## 2017-01-16 NOTE — Telephone Encounter (Signed)
Notes recorded by Jannette Spanner, CMA on 01/16/2017 at 4:27 PM EDT ATC pt, no answer. Left message for pt to call back.  ------  Notes recorded by Deneise Lever, MD on 01/16/2017 at 8:59 AM EDT CT chest shows small nodule in the upper right lung which needs to be followed to see if it will go away or grow.  Please schedule future non-cpntrast CT chest in 3 months, followed within a week or wo with an appointment with me.   Notes recorded by Jannette Spanner, CMA on 01/16/2017 at 4:27 PM EDT ATC pt, no answer. Left message for pt to call back.  ------  Notes recorded by Lorane Gell, CMA on 01/14/2017 at 5:12 PM EDT ATC NA ------  Notes recorded by Deneise Lever, MD on 01/14/2017 at 11:28 AM EDT Lab- glucose elevated 203. Please discuss with primary physician.    Pt is aware of results. Order placed for CT chest in 3 months and appt made for [facility] 04/17/17 at 4:30pm.

## 2017-02-13 ENCOUNTER — Telehealth: Payer: Self-pay | Admitting: Internal Medicine

## 2017-02-13 NOTE — Telephone Encounter (Signed)
Left message for patient to call back tomorrow morning since the phones are about to be turned off.

## 2017-02-17 NOTE — Telephone Encounter (Signed)
lmomtcb x 2  

## 2017-02-18 DIAGNOSIS — F17210 Nicotine dependence, cigarettes, uncomplicated: Secondary | ICD-10-CM | POA: Insufficient documentation

## 2017-02-18 NOTE — Telephone Encounter (Signed)
lmtcb x1 for pt. 

## 2017-02-19 NOTE — Telephone Encounter (Signed)
Closing msg per triage protocol 

## 2017-02-20 ENCOUNTER — Telehealth: Payer: Self-pay | Admitting: Internal Medicine

## 2017-02-20 DIAGNOSIS — G4733 Obstructive sleep apnea (adult) (pediatric): Secondary | ICD-10-CM

## 2017-02-20 NOTE — Telephone Encounter (Signed)
lmomtcb x1 

## 2017-02-21 NOTE — Telephone Encounter (Signed)
Left detailed message on pt's VM stating that [facility] had approved to the new CPAP machine. Will place order today.

## 2017-02-21 NOTE — Telephone Encounter (Signed)
Please order DME [facility]- please replace broken old CPAP machine, change to auto 10-20, continue mask of choice, humidifier, Supplies, AirView     Dx OSA

## 2017-02-21 NOTE — Telephone Encounter (Signed)
Pt is requesting order for new cpap machine to [facility], as her current machine is broken. Pt states her machine has a pop up that states "machine has exceeded life of motor". Pt states she has spoken with [facility] and was advised to contact [facility] for Rx.  Pt is currently using old cpap machine.  CY please advise. Thanks.   Current Outpatient Prescriptions on File Prior to Visit  Medication Sig Dispense Refill  . albuterol (PROVENTIL HFA;VENTOLIN HFA) 108 (90 BASE) MCG/ACT inhaler Inhale 2 puffs into the lungs every 6 (six) hours as needed for wheezing or shortness of breath. wheezing 1 Inhaler prn  . albuterol (PROVENTIL) (2.5 MG/3ML) 0.083% nebulizer solution 1 every 6 hours AND as needed 150 mL 12  . amLODipine (NORVASC) 5 MG tablet Take 5 mg by mouth daily.    Marland Kitchen aspirin 81 MG tablet Take 81 mg by mouth daily.    . cetirizine (ZYRTEC) 10 MG tablet Take 10 mg by mouth daily.    . chlorthalidone (HYGROTON) 25 MG tablet Take 25 mg by mouth daily.    . Cholecalciferol (HM VITAMIN D3) 4000 units CAPS Take 1 capsule by mouth daily.    . DULoxetine (CYMBALTA) 60 MG capsule Take 60 mg by mouth daily.    Marland Kitchen JANUVIA 50 MG tablet Take 1 tablet by mouth daily.    . lansoprazole (PREVACID) 30 MG capsule Take 30 mg by mouth every other day.    . metFORMIN (GLUCOPHAGE) 1000 MG tablet Take 1,000 mg by mouth 2 (two) times daily with a meal.    . oxybutynin (DITROPAN-XL) 10 MG 24 hr tablet Take 10 mg by mouth 2 (two) times daily.    . polyethylene glycol (MIRALAX) packet Take 17 g by mouth 2 (two) times daily. (Patient not taking: Reported on 12/30/2016) 14 each 0  . pregabalin (LYRICA) 100 MG capsule Take 100 mg by mouth 3 (three) times daily.     . rosuvastatin (CRESTOR) 40 MG tablet Take 1 tablet by mouth daily.    . traZODone (DESYREL) 100 MG tablet Take 50-150 mg by mouth at bedtime.     . valsartan (DIOVAN) 320 MG tablet Take 320 mg by mouth daily.  5   No current facility-administered medications on file prior to  visit.     Allergies  Allergen Reactions  . Nicotine Other (See Comments)    PATCH- gives blisters and rash  . Ppd [Tuberculin Purified Protein Derivative]     Pt states she has hives and itching with this test  . Sulfa Antibiotics Other (See Comments)    Dizzines, nausea  . Penicillins Rash

## 2017-04-10 ENCOUNTER — Ambulatory Visit (INDEPENDENT_AMBULATORY_CARE_PROVIDER_SITE_OTHER)
Admission: RE | Admit: 2017-04-10 | Discharge: 2017-04-10 | Disposition: A | Discharge status: Home / Self Care | Payer: Medicare Other | Source: Ambulatory Visit | Attending: Internal Medicine | Admitting: Internal Medicine

## 2017-04-10 DIAGNOSIS — R911 Solitary pulmonary nodule: Secondary | ICD-10-CM

## 2017-04-17 ENCOUNTER — Encounter: Payer: Self-pay | Admitting: Internal Medicine

## 2017-04-17 ENCOUNTER — Ambulatory Visit (INDEPENDENT_AMBULATORY_CARE_PROVIDER_SITE_OTHER): Payer: Medicare Other | Admitting: Internal Medicine

## 2017-04-17 VITALS — BP 126/82 | HR 73 | Ht 68.5 in | Wt 289.0 lb

## 2017-04-17 DIAGNOSIS — J449 Chronic obstructive pulmonary disease, unspecified: Secondary | ICD-10-CM | POA: Diagnosis not present

## 2017-04-17 DIAGNOSIS — Z720 Tobacco use: Secondary | ICD-10-CM | POA: Diagnosis not present

## 2017-04-17 DIAGNOSIS — G4733 Obstructive sleep apnea (adult) (pediatric): Secondary | ICD-10-CM | POA: Diagnosis not present

## 2017-04-17 DIAGNOSIS — IMO0001 Reserved for inherently not codable concepts without codable children: Secondary | ICD-10-CM

## 2017-04-17 DIAGNOSIS — R911 Solitary pulmonary nodule: Secondary | ICD-10-CM

## 2017-04-17 DIAGNOSIS — R918 Other nonspecific abnormal finding of lung field: Secondary | ICD-10-CM | POA: Diagnosis not present

## 2017-04-17 MED ORDER — DOXYCYCLINE HYCLATE 100 MG PO TABS: 100.0000 mg | ORAL_TABLET | Freq: Two times a day (BID) | ORAL | 0 refills | Status: DC

## 2017-04-17 NOTE — Progress Notes (Signed)
HPI  female smoker followed for OSA, COPD with history pneumonia, tobacco abuse, Chronic restrictive lung disease NPSG 01/04/03 [place] Severe OSA, RDI.( AHI )32.3/hr. [place] 11/24, weight 270 lbs. PFT 08/26/2011-restriction. FVC 68%, FEV1 62%, FEV1/FVC 0.93. Office Spirometry 02/24/2014-mild obstruction and restriction of exhaled volume. FVC 2.64/72%, FEV1 1.80/63%, FEV1/FVC 0.68, FEF 25-75% 0.92/35% Office Spirometry 10/19/2015-mild obstruction with mild restriction of exhaled volume. FVC 2.62/73%, FEV1 1.92/69%, ratio 0.73, FEF 25-75 percent 1.35/54% CT chest 01/15/17, 04/11/17 Anterior right upper lobe nodule, multiple bilateral small upper lobe nodules ------------------------------------------------------------------------------------------  12/30/16- 43year-old female smoker followed for OSA, COPD with history pneumonia, tobacco abuse, Chronic restrictive lung disease, CPAP 17, O2 2 L sleep/[facility] FOLLOWS FOR: Wears CPAP nightly. Denies problems with mask/pressure. DME: [facility];  Pt states that she has noticed her SOB worsening - pt still smoking. States that she knows the humid weather and smoking does not help her breathing.  She says nicotine patch blistered her skin. A different brand is better tolerated but not as strong. We discussed options and emphasized the importance of smoking cessation. Little cough and no chest pain or discolored sputum. Download 90% 4 hour compliance, AHI 0.5/hour indicating good control. She is committed to CPAP, using it even when she naps. CXR 10/20/15 IMPRESSION: 1.  Lingular infiltrate consistent with pneumonia. 2. Bibasilar atelectasis. 3.  Cardiomegaly with normal pulmonary vascularity .  04/17/17-  36year-old female smoker followed for OSA, COPD with history pneumonia, tobacco abuse, Chronic restrictive lung disease, lung nodule CPAP 17, O2 2 L sleep/[facility]  FOLLOWS YIF:OYDXAJ CT chest with patient and patient states she has not set up her new CPAP  machine yet-continues to use old one and did not bring card.  She needs to get her husband's help to move furniture to set up her new CPAP machine and will take care of it this weekend. Meanwhile continues her current CPAP without problems. At last visit chest x-ray had raise concern of possible endobronchial obstruction in the lingula. CT chest showed only residual minor atelectasis/scarring in that area but identified a right upper lobe soft density. Repeat CT 3 months later showed this lesion unchanged but new scattered small nodules. I reviewed these images with her and discussed significance. She denies sweats, fevers, change in chronic cough productive of clear mucus with no blood. No adenopathy. CXR 12/30/16 IMPRESSION: Bronchitic changes with persistent abnormal density at the anterior LEFT lung base question chronic lingular atelectasis; recommend CT chest with contrast to exclude central obstructing or endobronchial Lesion. CT chest 01/15/17 IMPRESSION: 1. No acute cardiopulmonary abnormalities. 2. Part solid nodule within the anteromedial right upper lobe measures 1.4 cm. Follow-up non-contrast CT recommended at 3-6 months to confirm persistence. If unchanged, and solid component remains <6 mm, annual CT is recommended until 5 years of stability has been established. If persistent these nodules should be considered highly suspicious if the solid component of the nodule is 6 mm or greater in size and enlarging. This recommendation follows the consensus statement: Guidelines for Management of Incidental Pulmonary Nodules Detected on CT Images:From the Fleischner Society 2017; published online before print (10.1148/radiol.2878676720). 3. Aortic Atherosclerosis (ICD10-I70.0) and Emphysema (ICD10-J43.9). Three vessel coronary artery calcification noted. CT chest 04/11/17 IMPRESSION: 1. Development of innumerable upper lobe predominant solid and mixed attenuation nodules bilaterally. This  suggests an interval infectious or inflammatory process. Correlate with infectious symptoms. 2. The anteromedial mixed attenuation right upper lobe pulmonary nodule measures similar. Given concurrent likely infectious pulmonary nodules, consider antibiotic therapy and CT follow-up at  3 months. 3. Age advanced coronary artery atherosclerosis. Recommend assessment of coronary risk factors and consideration of medical therapy. Aortic Atherosclerosis (ICD10-I70.0). 4. Hepatic steatosis and hepatomegaly. 5.  Emphysema (ICD10-J43.9).  ROS-see HPI  + = positive Constitutional:   No-   weight loss, night sweats, fevers, chills, fatigue, lassitude. HEENT:   No-  headaches, difficulty swallowing, tooth/dental problems, sore throat,       No-  sneezing, itching, ear ache, nasal congestion, post nasal drip,  CV:  No-   chest pain, orthopnea, PND, swelling in lower extremities, anasarca, dizziness, palpitations Resp: +  shortness of breath with exertion or at rest.              + productive cough,  +non-productive cough,  No- coughing up of blood.              No-   change in color of mucus.  No- wheezing.   Skin: No-   rash or lesions. GI:  No-   heartburn, indigestion, abdominal pain, nausea, vomiting, GU: MS:  No-   joint pain or swelling.  Neuro-     nothing unusual Psych:  No- change in mood or affect. No depression or anxiety.  No memory loss.  OBJ- Physical Exam General- Alert, Oriented, Affect-appropriate, Distress- none acute, obese,  Skin- rash-none, lesions- none, excoriation- none.  Lymphadenopathy- none Head- atraumatic            Eyes- Gross vision intact, PERRLA, conjunctivae and secretions clear, strabismus            Ears- Hearing, canals-normal            Nose- Clear, no-Septal dev, mucus, polyps, erosion, perforation             Throat- Mallampati II-III , mucosa clear , drainage- none, tonsils- atrophic.+ Mild hoarseness Neck- flexible , trachea midline, no stridor ,  thyroid nl, carotid no bruit Chest - symmetrical excursion , unlabored           Heart/CV- RRR , no murmur , no gallop  , no rub, nl s1 s2                           - JVD- none , edema R>L trace, stasis changes- none, varices- none           Lung- + diminished but clear, wheeze- none, + minimal dry, dullness-none, rub- none           Chest wall-  Abd-  Br/ Gen/ Rectal- Not done, not indicated Extrem- cyanosis- none, clubbing, none, atrophy- none, strength- nl Neuro- +resting tremor/ head bob persistent

## 2017-04-17 NOTE — Patient Instructions (Addendum)
We are going to treat with doxycycline for 2 weeks, then repeat a CT scan in 2 months, to see if the nodules are changing.  Script for doxycycline sent  Please call if your situation changes or you need our help  Order CT chest, no contrast     Dx lung nodules  Please keep trying to stop smoking

## 2017-04-18 ENCOUNTER — Encounter: Payer: Self-pay | Admitting: Internal Medicine

## 2017-04-18 DIAGNOSIS — R918 Other nonspecific abnormal finding of lung field: Secondary | ICD-10-CM | POA: Insufficient documentation

## 2017-04-18 NOTE — Assessment & Plan Note (Signed)
We reviewed the images and we discussed possible etiologies. Despite absence of systemic symptoms, we are going to follow the radiology lead and try treatment with doxycycline 2 weeks. We will then repeat CT scan without contrast in 2 months. She says that if this is cancer, she does not want chemotherapy that will make her feel sick or cause her to lose her hair and she does not want radiation therapy.

## 2017-04-18 NOTE — Assessment & Plan Note (Signed)
Chronic bronchitis symptoms with persistent cough, no exacerbation, nonpurulent. She still smokes and understands the importance of smoking cessation. Medications are appropriate.

## 2017-04-18 NOTE — Assessment & Plan Note (Signed)
Tobacco cessation support and encouragement are provided.

## 2017-04-18 NOTE — Assessment & Plan Note (Signed)
She is doing very well and remains compliant. She is going to get her replacement CPAP SHEENT set up and running this weekend.

## 2017-06-13 ENCOUNTER — Ambulatory Visit (INDEPENDENT_AMBULATORY_CARE_PROVIDER_SITE_OTHER)
Admission: RE | Admit: 2017-06-13 | Discharge: 2017-06-13 | Disposition: A | Discharge status: Home / Self Care | Payer: Medicare Other | Source: Ambulatory Visit | Attending: Internal Medicine | Admitting: Internal Medicine

## 2017-06-13 DIAGNOSIS — IMO0001 Reserved for inherently not codable concepts without codable children: Secondary | ICD-10-CM

## 2017-06-13 DIAGNOSIS — R911 Solitary pulmonary nodule: Secondary | ICD-10-CM

## 2017-06-16 ENCOUNTER — Encounter: Payer: Self-pay | Admitting: Internal Medicine

## 2017-06-16 ENCOUNTER — Ambulatory Visit (INDEPENDENT_AMBULATORY_CARE_PROVIDER_SITE_OTHER): Payer: Medicare Other | Admitting: Internal Medicine

## 2017-06-16 DIAGNOSIS — R918 Other nonspecific abnormal finding of lung field: Secondary | ICD-10-CM | POA: Diagnosis not present

## 2017-06-16 DIAGNOSIS — Z720 Tobacco use: Secondary | ICD-10-CM

## 2017-06-16 DIAGNOSIS — J449 Chronic obstructive pulmonary disease, unspecified: Secondary | ICD-10-CM

## 2017-06-16 DIAGNOSIS — G4733 Obstructive sleep apnea (adult) (pediatric): Secondary | ICD-10-CM | POA: Diagnosis not present

## 2017-06-16 NOTE — Assessment & Plan Note (Signed)
CT imaging shows fading small nodules, consistent with inflammatory pattern resolving.  We can watch at longer intervals and decide between CXR and CT next visit.

## 2017-06-16 NOTE — Patient Instructions (Signed)
We can continue CPAP auto 10-20, mask of choice, humidifier, supplies, airView      Dx OSA  I'm glad the CT looks better. We will decide at your next visit when we might want to check it again.  Please try to stop smoking !  Call if we can help

## 2017-06-16 NOTE — Progress Notes (Signed)
HPI  female smoker followed for OSA, COPD with history pneumonia, tobacco abuse, Chronic restrictive lung disease, lung nodules NPSG 01/04/03 [place] Severe OSA, RDI.( AHI )32.3/hr. [place] 11/24, weight 270 lbs. PFT 08/26/2011-restriction. FVC 68%, FEV1 62%, FEV1/FVC 0.93. Office Spirometry 02/24/2014-mild obstruction and restriction of exhaled volume. FVC 2.64/72%, FEV1 1.80/63%, FEV1/FVC 0.68, FEF 25-75% 0.92/35% Office Spirometry 10/19/2015-mild obstruction with mild restriction of exhaled volume. FVC 2.62/73%, FEV1 1.92/69%, ratio 0.73, FEF 25-75 percent 1.35/54% CT chest 01/15/17, 04/11/17 Anterior right upper lobe nodule, multiple bilateral small upper lobe nodules ------------------------------------------------------------------------------------------.  04/17/17-  4year-old female smoker followed for OSA, COPD with history pneumonia, tobacco abuse, Chronic restrictive lung disease, lung nodule CPAP 17, O2 2 L sleep/Advanced  FOLLOWS QIO:NGEXBM CT chest with patient and patient states she has not set up her new CPAP machine yet-continues to use old one and did not bring card.  She needs to get her husband's help to move furniture to set up her new CPAP machine and will take care of it this weekend. Meanwhile continues her current CPAP without problems. At last visit chest x-ray had raise concern of possible endobronchial obstruction in the lingula. CT chest showed only residual minor atelectasis/scarring in that area but identified a right upper lobe soft density. Repeat CT 3 months later showed this lesion unchanged but new scattered small nodules. I reviewed these images with her and discussed significance. She denies sweats, fevers, change in chronic cough productive of clear mucus with no blood. No adenopathy. CXR 12/30/16 IMPRESSION: Bronchitic changes with persistent abnormal density at the anterior LEFT lung base question chronic lingular atelectasis; recommend CT chest with contrast  to exclude central obstructing or endobronchial Lesion. CT chest 01/15/17 IMPRESSION: 1. No acute cardiopulmonary abnormalities. 2. Part solid nodule within the anteromedial right upper lobe measures 1.4 cm. Follow-up non-contrast CT recommended at 3-6 months to confirm persistence. If unchanged, and solid component remains <6 mm, annual CT is recommended until 5 years of stability has been established. If persistent these nodules should be considered highly suspicious if the solid component of the nodule is 6 mm or greater in size and enlarging. This recommendation follows the consensus statement: Guidelines for Management of Incidental Pulmonary Nodules Detected on CT Images:From the Fleischner Society 2017; published online before print (10.1148/radiol.8413244010). 3. Aortic Atherosclerosis (ICD10-I70.0) and Emphysema (ICD10-J43.9). Three vessel coronary artery calcification noted. CT chest 04/11/17 IMPRESSION: 1. Development of innumerable upper lobe predominant solid and mixed attenuation nodules bilaterally. This suggests an interval infectious or inflammatory process. Correlate with infectious symptoms. 2. The anteromedial mixed attenuation right upper lobe pulmonary nodule measures similar. Given concurrent likely infectious pulmonary nodules, consider antibiotic therapy and CT follow-up at 3 months. 3. Age advanced coronary artery atherosclerosis. Recommend assessment of coronary risk factors and consideration of medical therapy. Aortic Atherosclerosis (ICD10-I70.0). 4. Hepatic steatosis and hepatomegaly. 5.  Emphysema (ICD10-J43.9).  06/16/17- 38year-old female smoker followed for OSA, COPD with history pneumonia, tobacco abuse, Chronic restrictive lung disease, lung nodules CPAP auto 10-20 O2 2 L sleep/Advanced  Pt states that she has been doing good since last visit. Denies any complaints or concerns. Dry cough, unchanged.  Breathing feels stable.  Not prepared to stop  smoking. Uses CPAP anytime she lies down and feels much better with it.  Download compliance 97%, AHI 0.4/hour We reviewed her CT images. CT chest  06/13/17 IMPRESSION: 1. Much improved CT appearance of the chest when compared to the prior examination. Some residual small airspace nodules and ground-glass nodules likely reflecting  some residual inflammation or atypical infection. Repeat chest CT in 3 months is suggested. 2. No worrisome pulmonary lesions or new/acute pulmonary findings. 3. Hepatosplenomegaly and fatty infiltration of the liver. Aortic Atherosclerosis (ICD10-I70.0) and Emphysema (ICD10-J43.9).  ROS-see HPI  + = positive Constitutional:   No-   weight loss, night sweats, fevers, chills, fatigue, lassitude. HEENT:   No-  headaches, difficulty swallowing, tooth/dental problems, sore throat,       No-  sneezing, itching, ear ache, nasal congestion, post nasal drip,  CV:  No-   chest pain, orthopnea, PND, swelling in lower extremities, anasarca, dizziness, palpitations Resp: +  shortness of breath with exertion or at rest.              + productive cough,  +non-productive cough,  No- coughing up of blood.              No-   change in color of mucus.  No- wheezing.   Skin: No-   rash or lesions. GI:  No-   heartburn, indigestion, abdominal pain, nausea, vomiting, GU: MS:  No-   joint pain or swelling.  Neuro-     + chronic tremor Psych:  No- change in mood or affect. No depression or anxiety.  No memory loss.  OBJ- Physical Exam General- Alert, Oriented, Affect-appropriate, Distress- none acute, +obese,  Skin- rash-none, lesions- none, excoriation- none.  Lymphadenopathy- none Head- atraumatic            Eyes- Gross vision intact, PERRLA, conjunctivae and secretions clear, strabismus            Ears- Hearing, canals-normal            Nose- Clear, no-Septal dev, mucus, polyps, erosion, perforation             Throat- Mallampati II-III , mucosa clear , drainage- none,  tonsils- atrophic.+ Mild hoarseness Neck- flexible , trachea midline, no stridor , thyroid nl, carotid no bruit Chest - symmetrical excursion , unlabored           Heart/CV- RRR , no murmur , no gallop  , no rub, nl s1 s2                           - JVD- none , edema R>L trace, stasis changes- none, varices- none           Lung- + diminished but clear, wheeze- none, cough+, dullness-none, rub- none           Chest wall-  Abd-  Br/ Gen/ Rectal- Not done, not indicated Extrem- cyanosis- none, clubbing, none, atrophy- none, strength- nl Neuro- +resting tremor/ head bob persistent

## 2017-06-16 NOTE — Assessment & Plan Note (Signed)
She has only mild obstructive airways disease and stable chronic cough, despite long-term smoking.  We discussed medications.

## 2017-06-16 NOTE — Assessment & Plan Note (Addendum)
She is not motivated to stop but I continue to press support.

## 2017-06-16 NOTE — Assessment & Plan Note (Signed)
She depends on CPAP and will not sleep without it.  Download confirms good compliance and control.  No changes needed.

## 2017-06-17 ENCOUNTER — Ambulatory Visit: Payer: Medicare Other | Admitting: Internal Medicine

## 2017-06-18 ENCOUNTER — Other Ambulatory Visit: Payer: Medicare Other

## 2017-07-30 DIAGNOSIS — M899 Disorder of bone, unspecified: Secondary | ICD-10-CM | POA: Insufficient documentation

## 2017-07-30 DIAGNOSIS — G8929 Other chronic pain: Secondary | ICD-10-CM | POA: Insufficient documentation

## 2017-08-01 ENCOUNTER — Telehealth: Payer: Self-pay | Admitting: Internal Medicine
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
## 2017-08-01 NOTE — Telephone Encounter (Signed)
Noted  

## 2017-08-08 DIAGNOSIS — S83242A Other tear of medial meniscus, current injury, left knee, initial encounter: Secondary | ICD-10-CM | POA: Insufficient documentation

## 2017-10-16 DIAGNOSIS — E876 Hypokalemia: Secondary | ICD-10-CM | POA: Insufficient documentation

## 2017-10-16 DIAGNOSIS — R790 Abnormal level of blood mineral: Secondary | ICD-10-CM | POA: Insufficient documentation

## 2017-12-01 DIAGNOSIS — H50812 Duane's syndrome, left eye: Secondary | ICD-10-CM | POA: Insufficient documentation

## 2017-12-11 ENCOUNTER — Other Ambulatory Visit: Payer: Self-pay | Admitting: Internal Medicine

## 2018-01-01 ENCOUNTER — Ambulatory Visit: Payer: Medicare Other | Admitting: Internal Medicine

## 2018-01-05 ENCOUNTER — Ambulatory Visit: Payer: Medicare Other | Admitting: Internal Medicine

## 2018-01-06 ENCOUNTER — Ambulatory Visit: Payer: Medicare Other | Admitting: Internal Medicine

## 2018-01-08 ENCOUNTER — Ambulatory Visit (INDEPENDENT_AMBULATORY_CARE_PROVIDER_SITE_OTHER): Payer: Medicare HMO | Admitting: Internal Medicine

## 2018-01-08 ENCOUNTER — Encounter: Payer: Self-pay | Admitting: Pulmonary Disease

## 2018-01-08 ENCOUNTER — Encounter: Payer: Self-pay | Admitting: Internal Medicine

## 2018-01-08 DIAGNOSIS — R911 Solitary pulmonary nodule: Secondary | ICD-10-CM | POA: Diagnosis not present

## 2018-01-08 DIAGNOSIS — R918 Other nonspecific abnormal finding of lung field: Secondary | ICD-10-CM

## 2018-01-08 DIAGNOSIS — G4733 Obstructive sleep apnea (adult) (pediatric): Secondary | ICD-10-CM

## 2018-01-08 DIAGNOSIS — J449 Chronic obstructive pulmonary disease, unspecified: Secondary | ICD-10-CM | POA: Diagnosis not present

## 2018-01-08 DIAGNOSIS — Z720 Tobacco use: Secondary | ICD-10-CM

## 2018-01-08 NOTE — Patient Instructions (Signed)
We can continue CPAP auto 10-20, mask of choice, humidifier, supplies, AirView  Order- CT chest, no contrast,   Dx bilateral lung nodules  Please push yourself to stop smoking. You'll be glad you did.

## 2018-01-08 NOTE — Progress Notes (Signed)
HPI  female smoker followed for OSA, COPD with history pneumonia, tobacco abuse, Chronic restrictive lung disease, lung nodules NPSG 01/04/03 [place] Severe OSA, RDI.( AHI )32.3/hr. [place] 11/24, weight 270 lbs. PFT 08/26/2011-restriction. FVC 68%, FEV1 62%, FEV1/FVC 0.93. Office Spirometry 02/24/2014-mild obstruction and restriction of exhaled volume. FVC 2.64/72%, FEV1 1.80/63%, FEV1/FVC 0.68, FEF 25-75% 0.92/35% Office Spirometry 10/19/2015-mild obstruction with mild restriction of exhaled volume. FVC 2.62/73%, FEV1 1.92/69%, ratio 0.73, FEF 25-75 percent 1.35/54% CT chest 01/15/17, 04/11/17 Anterior right upper lobe nodule, multiple bilateral small upper lobe nodules ------------------------------------------------------------------------------------------  06/16/17- 39year-old female smoker followed for OSA, COPD with history pneumonia, tobacco abuse, Chronic restrictive lung disease, lung nodules CPAP auto 10-20 O2 2 L sleep/Advanced  Pt states that she has been doing good since last visit. Denies any complaints or concerns. Dry cough, unchanged.  Breathing feels stable.  Not prepared to stop smoking. Uses CPAP anytime she lies down and feels much better with it.  Download compliance 97%, AHI 0.4/hour We reviewed her CT images. CT chest  06/13/17 IMPRESSION: 1. Much improved CT appearance of the chest when compared to the prior examination. Some residual small airspace nodules and ground-glass nodules likely reflecting some residual inflammation or atypical infection. Repeat chest CT in 3 months is suggested. 2. No worrisome pulmonary lesions or new/acute pulmonary findings. 3. Hepatosplenomegaly and fatty infiltration of the liver. Aortic Atherosclerosis (ICD10-I70.0) and Emphysema (ICD10-J43.9).  01/08/2018- 62year-old female smoker followed for OSA, COPD with history pneumonia, tobacco abuse, Chronic restrictive lung disease, lung nodules CPAP auto 10-20 O2 2 L sleep/Advanced   -----6 month follow up for OSA. Patient states she has been feeling a squeezing sensation in her chest.  Squeezing sensation around bilateral ribcage. She relates it to her known thoracic radiculopathy and says not exertional, but worse lying down or with deep breath. Comes and goes but not progressive.  Happy with her CPAP. Download 97% compliance, AHI 0.6/ hr. Still not trying to quit smoking.  ROS-see HPI  + = positive Constitutional:   No-   weight loss, night sweats, fevers, chills, fatigue, lassitude. HEENT:   No-  headaches, difficulty swallowing, tooth/dental problems, sore throat,       No-  sneezing, itching, ear ache, nasal congestion, post nasal drip,  CV:  No-   chest pain, orthopnea, PND, swelling in lower extremities, anasarca, dizziness, palpitations Resp: +  shortness of breath with exertion or at rest.              + productive cough,  +non-productive cough,  No- coughing up of blood.              No-   change in color of mucus.  No- wheezing.   Skin: No-   rash or lesions. GI:  No-   heartburn, indigestion, abdominal pain, nausea, vomiting, GU: MS:  No-   joint pain or swelling.  Neuro-     + chronic tremor Psych:  No- change in mood or affect. No depression or anxiety.  No memory loss.  OBJ- Physical Exam   + odor tobacco General- Alert, Oriented, Affect-appropriate, Distress- none acute, +obese,  Skin- rash-none, lesions- none, excoriation- none.  Lymphadenopathy- none Head- atraumatic            Eyes- Gross vision intact, PERRLA, conjunctivae and secretions clear, strabismus            Ears- Hearing, canals-normal            Nose- Clear, no-Septal dev, mucus, polyps,  erosion, perforation             Throat- Mallampati II-III , mucosa clear , drainage- none, tonsils- atrophic.+ Mild hoarseness Neck- flexible , trachea midline, no stridor , thyroid nl, carotid no bruit Chest - symmetrical excursion , unlabored           Heart/CV- RRR , no murmur , no gallop  , no  rub, nl s1 s2                           - JVD- none , edema R>L trace, stasis changes- none, varices- none           Lung- + diminished but clear, wheeze- none, cough+light, dullness-none, rub- none           Chest wall-  Abd-  Br/ Gen/ Rectal- Not done, not indicated Extrem- cyanosis- none, clubbing, none, atrophy- none, strength- nl Neuro- +resting tremor/ head bob persistent

## 2018-01-13 ENCOUNTER — Ambulatory Visit: Payer: Medicare Other | Admitting: Internal Medicine

## 2018-01-20 ENCOUNTER — Other Ambulatory Visit: Payer: Medicare HMO

## 2018-01-23 ENCOUNTER — Ambulatory Visit: Payer: Medicare HMO | Admitting: Pulmonary Disease

## 2018-01-23 ENCOUNTER — Encounter: Payer: Self-pay | Admitting: Pulmonary Disease

## 2018-01-23 ENCOUNTER — Ambulatory Visit (INDEPENDENT_AMBULATORY_CARE_PROVIDER_SITE_OTHER)
Admission: RE | Admit: 2018-01-23 | Discharge: 2018-01-23 | Disposition: A | Discharge status: Home / Self Care | Payer: Medicare HMO | Source: Ambulatory Visit | Attending: Pulmonary Disease | Admitting: Pulmonary Disease

## 2018-01-23 VITALS — BP 128/76 | HR 78 | Temp 97.7°F | Ht 68.5 in | Wt 275.0 lb

## 2018-01-23 DIAGNOSIS — Z720 Tobacco use: Secondary | ICD-10-CM | POA: Diagnosis not present

## 2018-01-23 DIAGNOSIS — J449 Chronic obstructive pulmonary disease, unspecified: Secondary | ICD-10-CM

## 2018-01-23 DIAGNOSIS — R918 Other nonspecific abnormal finding of lung field: Secondary | ICD-10-CM | POA: Diagnosis not present

## 2018-01-23 MED ORDER — PREDNISONE 10 MG PO TABS: ORAL_TABLET | ORAL | 0 refills | Status: DC

## 2018-01-23 MED ORDER — LEVOFLOXACIN 500 MG PO TABS: 500.0000 mg | ORAL_TABLET | Freq: Every day | ORAL | 0 refills | Status: DC

## 2018-01-23 NOTE — Progress Notes (Signed)
   Subjective:    Patient ID: [name] [name], female    DOB: 1955-11-30, 62 y.o.   MRN: [id number]  HPI    Chief Complaint  Patient presents with  . Acute Visit    CY pt. Went to UC on 01/17/18 and was prescribed Doxy and prednisone. Is still taking the Doxy. Still has SOB, wheezing and fatigue. Nonproductive cough. Sore throat.     62 year old obese smoker, unclear whether she has true COPD or mainly restrictive lung disease, presents for acute visit.  She was recently seen 2 weeks ago and follow-up CT chest was scheduled for bilateral nodules.  She started with fever low-grade 100.4 followed by cough and sputum production, she does not expectorates it is not able to provide color, husband was sick at the same time, went to urgent care 6/22 chest x-ray not done, given doxycycline and prednisone which she has taken for 5 days but does not feel any improved.  Denies dizziness, pedal edema, orthopnea paroxysmal nocturnal dyspnea. Has albuterol nebs but only takes this once a day.  He is not on any kind of maintenance inhaler.  Continues to smoke a pack and half per day!   Significant tests/ events reviewed   NPSG 01/04/03 [place] Severe OSA, RDI.( AHI )32.3/hr. [place] 11/24, weight 270 lbs. PFT 08/26/2011-restriction. FVC 68%, FEV1 62%, FEV1/FVC 0.93.  Office Spirometry 10/19/2015-mild obstruction with mild restriction of exhaled volume. FVC 2.62/73%, FEV1 1.92/69%, ratio 0.73 CT chest 01/15/17, 04/11/17 Anterior right upper lobe nodule, multiple bilateral small upper lobe nodules  Past Medical History:  Diagnosis Date  . Anxiety   . COPD (chronic obstructive pulmonary disease) ([place])   . Depression   . Hyperlipidemia   . Hypertension   . Restrictive lung disease   . Sleep apnea        Review of Systems Positive for fever and sore throat  neg for any significant dysphagia, itching, sneezing, nasal congestion or excess/ purulent secretions,  chills, sweats, unintended wt  loss, pleuritic or exertional cp, hempoptysis, orthopnea pnd or change in chronic leg swelling.   Also denies presyncope, palpitations, heartburn, abdominal pain, nausea, vomiting, diarrhea or change in bowel or urinary habits, dysuria,hematuria, rash, arthralgias, visual complaints, headache, numbness weakness or ataxia.     Objective:   Physical Exam  Gen. Pleasant, obese, in no distress, normal affect ENT - no thrush, no post nasal drip, class 2-3 airway Neck: No JVD, no thyromegaly, no carotid bruits Lungs: no use of accessory muscles, no dullness to percussion, diffuse exp rhonchi  Cardiovascular: Rhythm regular, heart sounds  normal, no murmurs or gallops, no peripheral edema Abdomen: soft and non-tender, no hepatosplenomegaly, BS normal. Musculoskeletal: No deformities, no cyanosis or clubbing Neuro:  alert, non focal, no tremors       Assessment & Plan:

## 2018-01-23 NOTE — Patient Instructions (Signed)
Levaquin 500 mg daily for 5 days. Prednisone 10 mg tabs Take 4 tabs  daily with food x 4 days, then 3 tabs daily x 4 days, then 2 tabs daily x 4 days, then 1 tab daily x4 days then stop. #40  Chest x-ray today.  Cancel CT chest until you are improved call us back in 2 weeks and we can schedule

## 2018-01-23 NOTE — Assessment & Plan Note (Signed)
Unclear if she has true COPD, only 1 pound prior spirometry has shown ratio less than 70.  But we will treat her as an exacerbation/acute bronchitis  Levaquin 500 mg daily for 5 days. Prednisone 10 mg tabs Take 4 tabs  daily with food x 4 days, then 3 tabs daily x 4 days, then 2 tabs daily x 4 days, then 1 tab daily x4 days then stop. #40  Chest x-ray today.  To rule out pneumonia

## 2018-01-23 NOTE — Assessment & Plan Note (Signed)
Cancel CT chest until you are improved call us back in 2 weeks and we can schedule

## 2018-01-23 NOTE — Assessment & Plan Note (Signed)
Counseled on tobacco cessation.  She is not willing to commit to a quit date

## 2018-01-25 ENCOUNTER — Encounter: Payer: Self-pay | Admitting: Internal Medicine

## 2018-01-25 NOTE — Assessment & Plan Note (Signed)
Smoker Plan- chest CT

## 2018-01-25 NOTE — Assessment & Plan Note (Signed)
Again pressed the case for smoking cessation and offered support.

## 2018-01-25 NOTE — Assessment & Plan Note (Signed)
Expect gradual progression as normal course of disease, although she ha maintained better function than expected. With her description fo musculoskeletal chest discomfort and smoking hx, will order CT chest.

## 2018-01-25 NOTE — Assessment & Plan Note (Signed)
She continues to benefit with improved sleep. Download confirms good compliance and control. Plan- continue CPAP auto 10-20

## 2018-01-26 ENCOUNTER — Telehealth: Payer: Self-pay | Admitting: Internal Medicine

## 2018-01-26 ENCOUNTER — Inpatient Hospital Stay: Admission: RE | Admit: 2018-01-26 | Payer: Medicare HMO | Source: Ambulatory Visit

## 2018-01-26 NOTE — Telephone Encounter (Signed)
Called and spoke with patient, she is aware of results and verbalized understanding. Nothing further needed.  

## 2018-04-20 DIAGNOSIS — G500 Trigeminal neuralgia: Secondary | ICD-10-CM | POA: Insufficient documentation

## 2018-04-20 DIAGNOSIS — M5412 Radiculopathy, cervical region: Secondary | ICD-10-CM | POA: Insufficient documentation

## 2018-04-20 DIAGNOSIS — G8929 Other chronic pain: Secondary | ICD-10-CM | POA: Insufficient documentation

## 2018-07-13 ENCOUNTER — Ambulatory Visit: Payer: Medicare HMO | Admitting: Internal Medicine

## 2018-07-13 ENCOUNTER — Encounter: Payer: Self-pay | Admitting: Internal Medicine

## 2018-07-13 VITALS — BP 120/58 | HR 114 | Wt 285.2 lb

## 2018-07-13 DIAGNOSIS — Z720 Tobacco use: Secondary | ICD-10-CM | POA: Diagnosis not present

## 2018-07-13 DIAGNOSIS — G4733 Obstructive sleep apnea (adult) (pediatric): Secondary | ICD-10-CM

## 2018-07-13 DIAGNOSIS — J449 Chronic obstructive pulmonary disease, unspecified: Secondary | ICD-10-CM | POA: Diagnosis not present

## 2018-07-13 MED ORDER — ALBUTEROL SULFATE HFA 108 (90 BASE) MCG/ACT IN AERS: 2.0000 | INHALATION_SPRAY | Freq: Four times a day (QID) | RESPIRATORY_TRACT | 99 refills | Status: DC | PRN

## 2018-07-13 NOTE — Progress Notes (Signed)
HPI  female smoker followed for OSA, COPD with history pneumonia, tobacco abuse, Chronic restrictive lung disease, lung nodules NPSG 01/04/03 [place] Severe OSA, RDI.( AHI )32.3/hr. [place] 11/24, weight 270 lbs. PFT 08/26/2011-restriction. FVC 68%, FEV1 62%, FEV1/FVC 0.93. Office Spirometry 02/24/2014-mild obstruction and restriction of exhaled volume. FVC 2.64/72%, FEV1 1.80/63%, FEV1/FVC 0.68, FEF 25-75% 0.92/35% Office Spirometry 10/19/2015-mild obstruction with mild restriction of exhaled volume. FVC 2.62/73%, FEV1 1.92/69%, ratio 0.73, FEF 25-75 percent 1.35/54% CT chest 01/15/17, 04/11/17 Anterior right upper lobe nodule, multiple bilateral small upper lobe nodules Office Spirometry 07/13/18-mild obstruction, mild restriction of FVC.  FVC 2.4/63%, FEV1 1.7/60%, ratio 1.73, FEF 25-75% 1.3/52% ------------------------------------------------------------------------------------------ 01/08/2018- 25year-old female smoker followed for OSA, COPD with history pneumonia, tobacco abuse, Chronic restrictive lung disease, lung nodules CPAP auto 10-20 O2 2 L sleep/Advanced  -----6 month follow up for OSA. Patient states she has been feeling a squeezing sensation in her chest.  Squeezing sensation around bilateral ribcage. She relates it to her known thoracic radiculopathy and says not exertional, but worse lying down or with deep breath. Comes and goes but not progressive.  Happy with her CPAP. Download 97% compliance, AHI 0.6/ hr. Still not trying to quit smoking.  07/13/2018- 74year-old female Smoker followed for OSA, COPD with history pneumonia, tobacco abuse, Chronic restrictive lung disease, lung nodules CPAP auto 10-20 O2 2 L sleep/Advanced  Download 97% compliance AHI 0.8/hour Body weight today 285 pounds -----did not get CT scan because she got a URI. Currently doing about the same. SOB with activity and continues with smokers cough pt states Lung nodule still too small to be visualized on  chest x-ray in June.  Suggests benign. Seen here by RA in June for exacerbation. Easy dyspnea on exertion walking, bending over, lying down.  Rescue inhaler less than once per week.  Not trying to stop smoking despite counseling. Office Spirometry 07/13/18-mild obstruction, mild restriction of FVC.  FVC 2.4/63%, FEV1 1.7/60%, ratio 1.73, FEF 25-75% 1.3/52% CXR 01/23/2018- IMPRESSION: No active cardiopulmonary disease.  ROS-see HPI  + = positive Constitutional:   No-   weight loss, night sweats, fevers, chills, fatigue, lassitude. HEENT:   No-  headaches, difficulty swallowing, tooth/dental problems, sore throat,       No-  sneezing, itching, ear ache, nasal congestion, post nasal drip,  CV:  No-   chest pain, orthopnea, PND, swelling in lower extremities, anasarca, dizziness, palpitations Resp: +  shortness of breath with exertion or at rest.              + productive cough,  +non-productive cough,  No- coughing up of blood.              No-   change in color of mucus.  No- wheezing.   Skin: No-   rash or lesions. GI:  No-   heartburn, indigestion, abdominal pain, nausea, vomiting, GU: MS:  No-   joint pain or swelling.  Neuro-     + chronic tremor Psych:  No- change in mood or affect. No depression or anxiety.  No memory loss.  OBJ- Physical Exam   + odor tobacco General- Alert, Oriented, Affect-appropriate, Distress- none acute, +obese,  Skin- rash-none, lesions- none, excoriation- none.  Lymphadenopathy- none Head- atraumatic            Eyes- Gross vision intact, PERRLA, conjunctivae and secretions clear, strabismus            Ears- Hearing, canals-normal  Nose- Clear, no-Septal dev, mucus, polyps, erosion, perforation             Throat- Mallampati II-III , mucosa clear , drainage- none, tonsils- atrophic.+ Mild hoarseness Neck- flexible , trachea midline, no stridor , thyroid nl, carotid no bruit Chest - symmetrical excursion , unlabored           Heart/CV- RRR , no  murmur , no gallop  , no rub, nl s1 s2                           - JVD- none , edema R>L trace, stasis changes- none, varices- none           Lung- + diminished but clear, wheeze- none, cough+light, dullness-none, rub- none           Chest wall-  Abd-  Br/ Gen/ Rectal- Not done, not indicated Extrem- cyanosis- none, clubbing, none, atrophy- none, strength- nl Neuro- +resting tremor/ head bob persistent

## 2018-07-13 NOTE — Patient Instructions (Addendum)
Refill sent for rescue inhaler  Order- office spirometry      Dx COPD mixed type  You can check with Dr Jeralene Huff about whether you are due for a pneumonia vaccine  You can also ask Dr Jeralene Huff about cardiac evaluation because of your smoking and shortness of breath with exertion  Please do everything you can to stop smoking. You CAN stop, and you need to.  Please call us if we can help

## 2018-09-08 NOTE — Assessment & Plan Note (Addendum)
Pulmonary function is well-preserved despite her ongoing smoking.  Obesity with deconditioning and uncertain cardiac component should be considered Plan-refill rescue inhaler

## 2018-09-08 NOTE — Assessment & Plan Note (Signed)
Counseling, offers of support and encouragement to quit

## 2018-09-08 NOTE — Assessment & Plan Note (Signed)
She continues to benefit from CPAP with improved sleep.  Download confirms good compliance and control. Plan-continue CPAP 10-20

## 2018-10-26 IMAGING — DX DG CHEST 2V
2 series · 2 of 2 positions shown · non-contrast
Comparison: 10/19/2015

CLINICAL DATA: Chronic cough, congestion, shortness of breath,
history hypertension, diabetes mellitus, smoking, asthma, COPD

EXAM:
CHEST  2 VIEW

[chest pa]
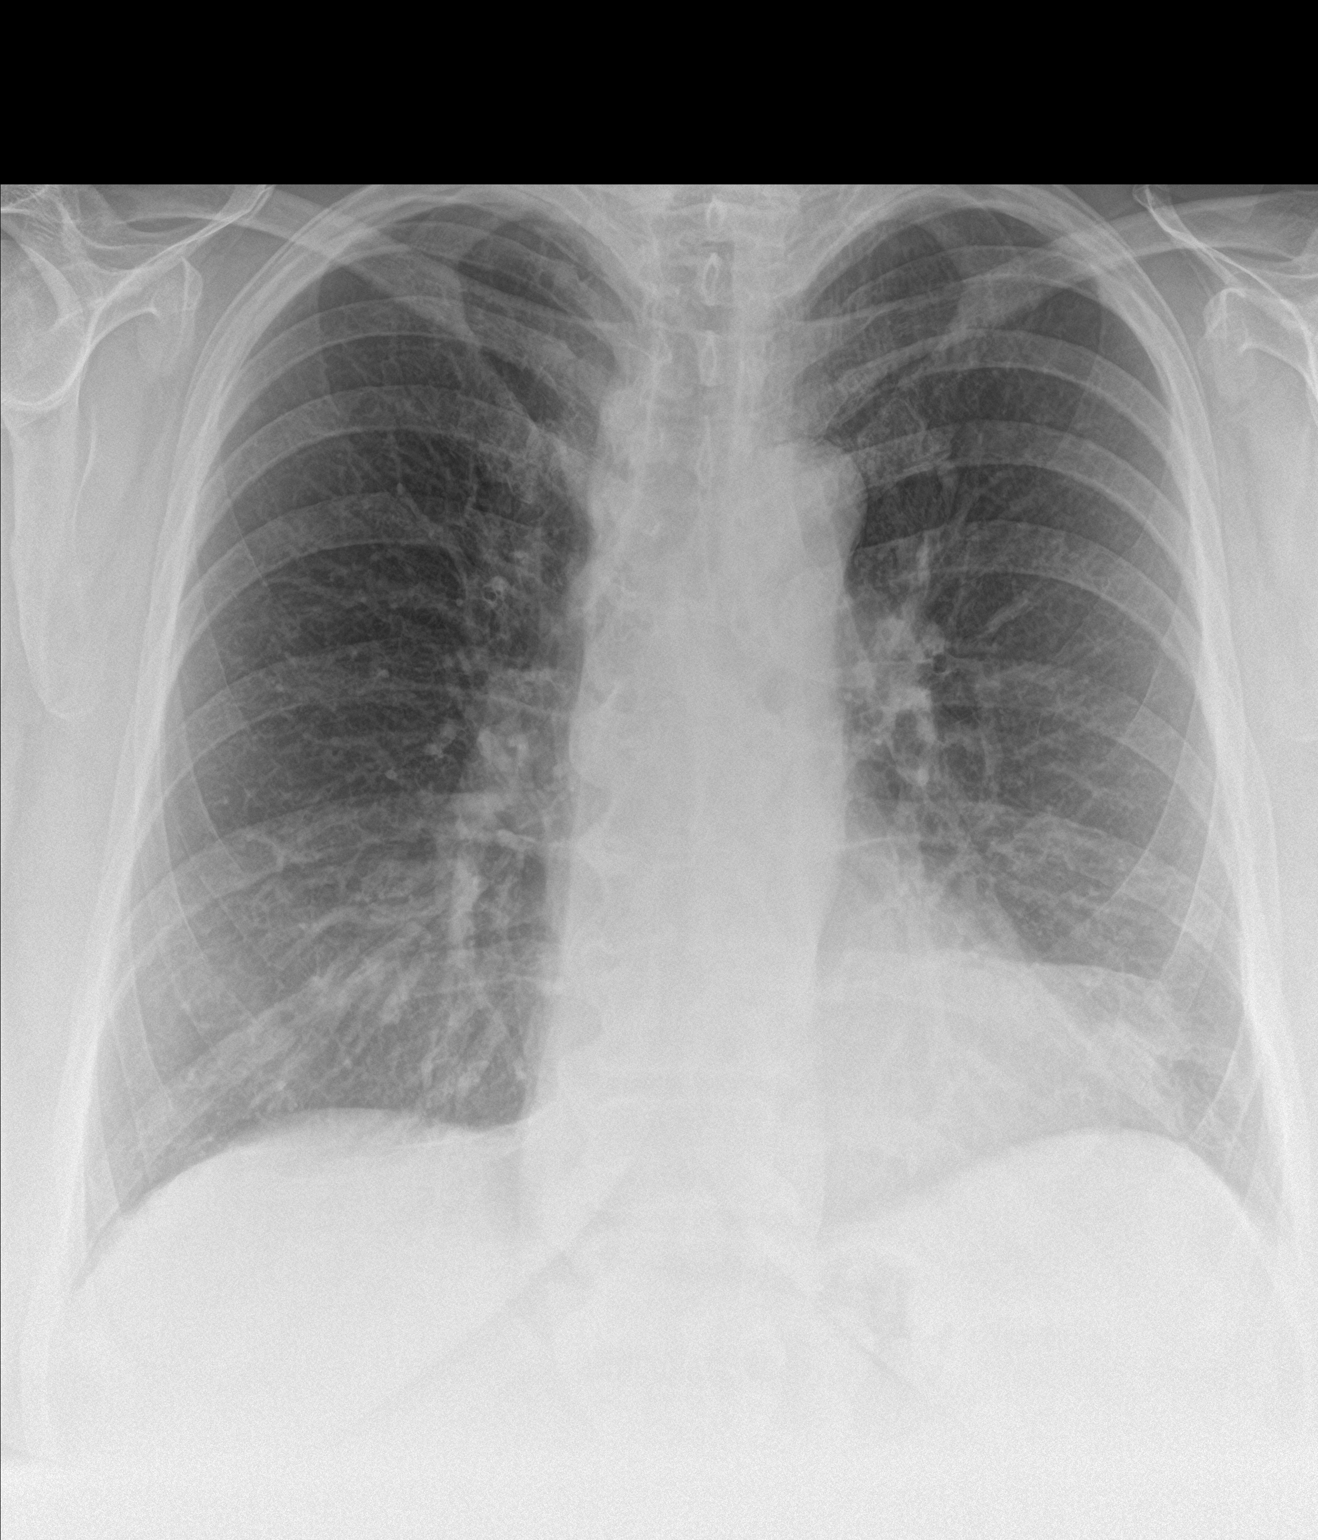

[chest lat]
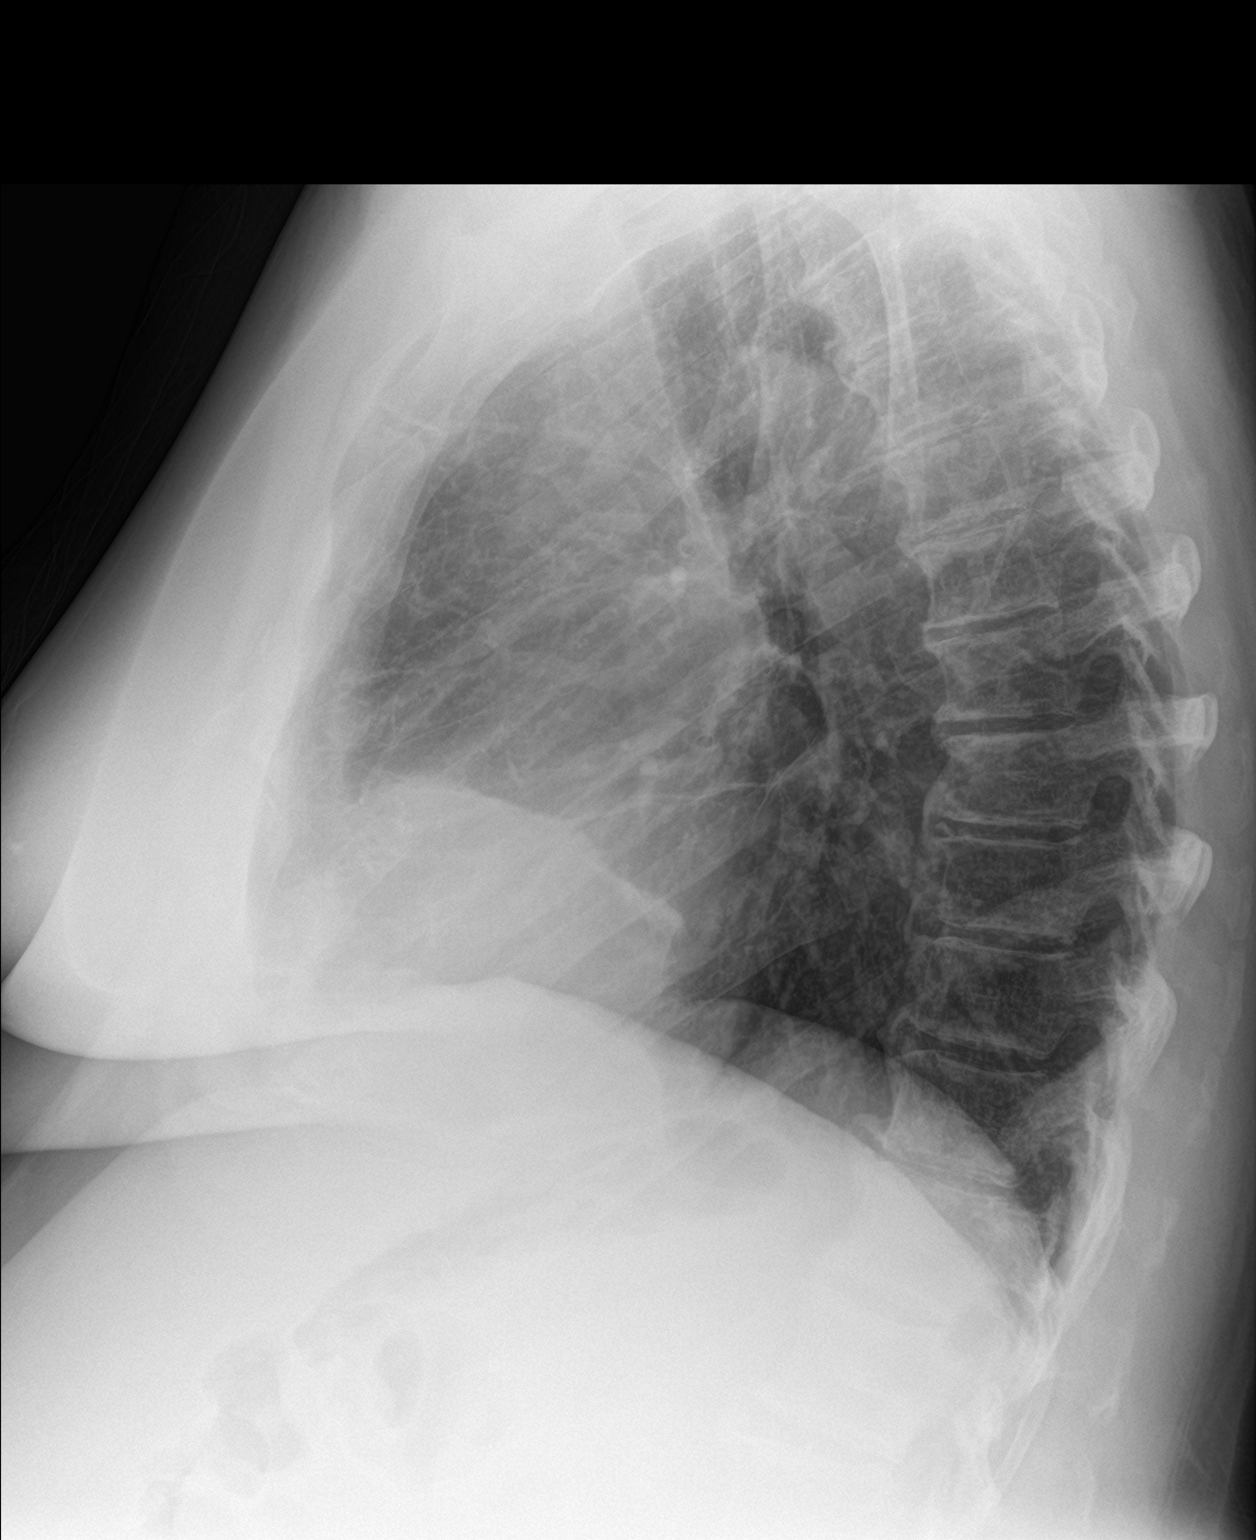

[2 of 2 positions shown; findings below may reference images not displayed]

FINDINGS: Upper normal heart size.

Mediastinal contours and pulmonary vascularity normal.

Bronchitic changes with persistent abnormal density at the anterior
LEFT lung base.

This could represent chronic atelectasis, less likely chronic
infiltrate.

Remaining lungs clear.

No pleural effusion or pneumothorax.

Scattered degenerative disc disease changes thoracic spine.
IMPRESSION: Bronchitic changes with persistent abnormal density at the anterior
LEFT lung base question chronic lingular atelectasis; recommend CT
chest with contrast to exclude central obstructing or endobronchial
lesion.

## 2019-03-26 ENCOUNTER — Ambulatory Visit: Payer: Medicare HMO | Admitting: Adult Health

## 2019-05-05 ENCOUNTER — Telehealth: Payer: Self-pay | Admitting: Internal Medicine

## 2019-05-05 NOTE — Telephone Encounter (Signed)
Called and spoke w/ pt. Pt states she has had cough and congestion, especially at night, for months now. Pt denies acute symptoms such as fever/chills/body aches. Pt denies recently travel. Denies positive or pending covid test. States her spouse got tested for negative for covd for a procedure. She states her concerns are chronic and she would like to speak with CY about them. I offered her an appt with CY on 05/07/2019 at 10:00 AM, which pt agreed to. Pt is aware of the office location and mask/no visitor policies. Appt has been scheduled. Nothing further needed at this time.

## 2019-05-06 ENCOUNTER — Encounter: Payer: Self-pay | Admitting: Internal Medicine

## 2019-05-07 ENCOUNTER — Other Ambulatory Visit: Payer: Self-pay

## 2019-05-07 ENCOUNTER — Ambulatory Visit (INDEPENDENT_AMBULATORY_CARE_PROVIDER_SITE_OTHER): Payer: Medicare Other | Admitting: Internal Medicine

## 2019-05-07 ENCOUNTER — Ambulatory Visit (INDEPENDENT_AMBULATORY_CARE_PROVIDER_SITE_OTHER): Payer: Medicare Other

## 2019-05-07 ENCOUNTER — Encounter: Payer: Self-pay | Admitting: Internal Medicine

## 2019-05-07 VITALS — BP 138/80 | HR 77 | Temp 97.7°F | Ht 68.0 in | Wt 289.0 lb

## 2019-05-07 DIAGNOSIS — Z720 Tobacco use: Secondary | ICD-10-CM | POA: Diagnosis not present

## 2019-05-07 DIAGNOSIS — Z23 Encounter for immunization: Secondary | ICD-10-CM | POA: Diagnosis not present

## 2019-05-07 DIAGNOSIS — J449 Chronic obstructive pulmonary disease, unspecified: Secondary | ICD-10-CM

## 2019-05-07 DIAGNOSIS — G4733 Obstructive sleep apnea (adult) (pediatric): Secondary | ICD-10-CM | POA: Diagnosis not present

## 2019-05-07 MED ORDER — BUDESONIDE-FORMOTEROL FUMARATE 160-4.5 MCG/ACT IN AERO: INHALATION_SPRAY | RESPIRATORY_TRACT | 6 refills | Status: DC

## 2019-05-07 MED ORDER — BUDESONIDE-FORMOTEROL FUMARATE 160-4.5 MCG/ACT IN AERO: 2.0000 | INHALATION_SPRAY | Freq: Two times a day (BID) | RESPIRATORY_TRACT | 0 refills | Status: DC

## 2019-05-07 MED ORDER — ALBUTEROL SULFATE HFA 108 (90 BASE) MCG/ACT IN AERS: 2.0000 | INHALATION_SPRAY | Freq: Four times a day (QID) | RESPIRATORY_TRACT | 12 refills | Status: DC | PRN

## 2019-05-07 NOTE — Assessment & Plan Note (Signed)
Has no inhalers, just her neb. Plan- CXR, Add Symbicort 160 and rescue inhaler with discussion

## 2019-05-07 NOTE — Assessment & Plan Note (Addendum)
Ongoing problem, catching up with her. She has not been motivated to quit despite counseling.

## 2019-05-07 NOTE — Assessment & Plan Note (Signed)
Continues to benefit from CPAP Plan continue auto 10-20

## 2019-05-07 NOTE — Progress Notes (Signed)
HPI  female smoker followed for OSA, COPD with history pneumonia, tobacco abuse, Chronic restrictive lung disease, lung nodules, Complicated by HBP, Atopic dermatitis,  NPSG 01/04/03 [place] Severe OSA, RDI.( AHI )32.3/hr. [place] 11/24, weight 270 lbs. PFT 08/26/2011-restriction. FVC 68%, FEV1 62%, FEV1/FVC 0.93. Office Spirometry 02/24/2014-mild obstruction and restriction of exhaled volume. FVC 2.64/72%, FEV1 1.80/63%, FEV1/FVC 0.68, FEF 25-75% 0.92/35% Office Spirometry 10/19/2015-mild obstruction with mild restriction of exhaled volume. FVC 2.62/73%, FEV1 1.92/69%, ratio 0.73, FEF 25-75 percent 1.35/54% CT chest 01/15/17, 04/11/17 Anterior right upper lobe nodule, multiple bilateral small upper lobe nodules Office Spirometry 07/13/18-mild obstruction, mild restriction of FVC.  FVC 2.4/63%, FEV1 1.7/60%, ratio 1.73, FEF 25-75% 1.3/52% ------------------------------------------------------------------------------------------  07/13/2018- 24year-old female Smoker followed for OSA, COPD with history pneumonia, tobacco abuse, Chronic restrictive lung disease, lung nodules CPAP auto 10-20 O2 2 L sleep/Advanced  Download 97% compliance AHI 0.8/hour Body weight today 285 pounds -----did not get CT scan because she got a URI. Currently doing about the same. SOB with activity and continues with smokers cough pt states Lung nodule still too small to be visualized on chest x-ray in June.  Suggests benign. Seen here by RA in June for exacerbation. Easy dyspnea on exertion walking, bending over, lying down.  Rescue inhaler less than once per week.  Not trying to stop smoking despite counseling. Office Spirometry 07/13/18-mild obstruction, mild restriction of FVC.  FVC 2.4/63%, FEV1 1.7/60%, ratio 1.73, FEF 25-75% 1.3/52% CXR 01/23/2018- IMPRESSION: No active cardiopulmonary disease.  05/07/2019- 62year-old female Smoker followed for OSA, COPD with history pneumonia, tobacco abuse, Chronic restrictive lung  disease, lung nodules, Complicated by HBP, Atopic dermatitis,  CPAP auto 10-20 O2 2 L sleep/Advanced Download compliance 100%, AHI 0.7/ hr Comfortable with CPAP- no concerns. -----pt reports chronic cough & congestion at night and occasionally during the day; states she feels congestion in right side of chest resolved with cough - having a hard time coughing up congestion Body weight today 289 lbs Much stress- mother with Alzheimers has moved in; husband fell after back surgery and back in hospital. Notes "crackle" in upper left chest when lying on left side. Cough clears it temporarily. Sputum clear, no fever.   ROS-see HPI  + = positive Constitutional:   No-   weight loss, night sweats, fevers, chills, fatigue, lassitude. HEENT:   No-  headaches, difficulty swallowing, tooth/dental problems, sore throat,       No-  sneezing, itching, ear ache, nasal congestion, post nasal drip,  CV:  No-   chest pain, orthopnea, PND, swelling in lower extremities, anasarca, dizziness, palpitations Resp: +  shortness of breath with exertion or at rest.              + productive cough,  +non-productive cough,  No- coughing up of blood.              No-   change in color of mucus.  No- wheezing.   Skin: No-   rash or lesions. GI:  No-   heartburn, indigestion, abdominal pain, nausea, vomiting, GU: MS:  No-   joint pain or swelling.  Neuro-     + chronic tremor Psych:  No- change in mood or affect. No depression or anxiety.  No memory loss.  OBJ- Physical Exam   + odor tobacco General- Alert, Oriented, Affect-appropriate, Distress- none acute, +obese,  Skin- rash-none, lesions- none, excoriation- none.  Lymphadenopathy- none Head- atraumatic            Eyes- Gross  vision intact, PERRLA, conjunctivae and secretions clear, strabismus            Ears- Hearing, canals-normal            Nose- Clear, no-Septal dev, mucus, polyps, erosion, perforation             Throat- Mallampati II-III , mucosa clear ,  drainage- none, tonsils- atrophic.+ Mild hoarseness Neck- flexible , trachea midline, no stridor , thyroid nl, carotid no bruit Chest - symmetrical excursion , unlabored           Heart/CV- RRR , no murmur , no gallop  , no rub, nl s1 s2                           - JVD- none , edema R>L trace, stasis changes- none, varices- none           Lung- + diminished but clear, wheeze- none, cough+loose, dullness-none, rub- none           Chest wall-  Abd-  Br/ Gen/ Rectal- Not done, not indicated Extrem- cyanosis- none, clubbing, none, atrophy- none, strength- nl Neuro- +resting tremor/ head bob persistent

## 2019-05-07 NOTE — Patient Instructions (Signed)
Order- flu vax senior  Order- CXR   Dx COPD mixed type  Sample and Script printed for Symbicort maintenance inhaler    Inhale 2 puffs then rinse your mouth, twice daily  Script sent for albuterol rescue inhaler  Please try to stop smoking. I realize this is a difficult time for you right now, but things will get better.  Call if we can help

## 2019-05-10 ENCOUNTER — Telehealth: Payer: Self-pay | Admitting: Internal Medicine

## 2019-05-10 NOTE — Telephone Encounter (Signed)
Left detailed voice mail at patient request advising of chest xray result. Requested call back if there are any questions or concerns.   Nothing further needed at this time.

## 2019-06-18 DIAGNOSIS — F411 Generalized anxiety disorder: Secondary | ICD-10-CM | POA: Insufficient documentation

## 2019-06-18 DIAGNOSIS — F17200 Nicotine dependence, unspecified, uncomplicated: Secondary | ICD-10-CM | POA: Insufficient documentation

## 2019-09-06 ENCOUNTER — Ambulatory Visit: Payer: Medicare Other | Attending: Internal Medicine

## 2019-09-06 DIAGNOSIS — Z23 Encounter for immunization: Secondary | ICD-10-CM | POA: Insufficient documentation

## 2019-09-06 NOTE — Progress Notes (Signed)
   Covid-19 Vaccination Clinic  Name:  [name] [name]    MRN: [id number] DOB: 15-Nov-1955  09/06/2019  [name] was observed post Covid-19 immunization for 15 minutes without incidence. She was provided with Vaccine Information Sheet and instruction to access the V-Safe system.   [name] was instructed to call 911 with any severe reactions post vaccine: Marland Kitchen Difficulty breathing  . Swelling of your face and throat  . A fast heartbeat  . A bad rash all over your body  . Dizziness and weakness    Immunizations Administered    Name Date Dose VIS Date Route   Pfizer COVID-19 Vaccine 09/06/2019  4:18 PM 0.3 mL 07/09/2019 Intramuscular   Manufacturer: [employer]   Lot: [id number]   [place]: [id number]

## 2019-10-01 ENCOUNTER — Ambulatory Visit: Payer: Medicare Other | Attending: Internal Medicine

## 2019-10-01 DIAGNOSIS — Z23 Encounter for immunization: Secondary | ICD-10-CM | POA: Insufficient documentation

## 2019-10-01 NOTE — Progress Notes (Signed)
   Covid-19 Vaccination Clinic  Name:  [name] [name]    MRN: [id number] DOB: 09-Aug-1955  10/01/2019  [name] was observed post Covid-19 immunization for 15 minutes without incident. She was provided with Vaccine Information Sheet and instruction to access the V-Safe system.   [name] was instructed to call 911 with any severe reactions post vaccine: Marland Kitchen Difficulty breathing  . Swelling of face and throat  . A fast heartbeat  . A bad rash all over body  . Dizziness and weakness   Immunizations Administered    Name Date Dose VIS Date Route   Pfizer COVID-19 Vaccine 10/01/2019 12:41 PM 0.3 mL 07/09/2019 Intramuscular   Manufacturer: [place]   Lot: [id number]   [place]: [id number]

## 2019-10-09 ENCOUNTER — Encounter: Payer: Self-pay | Admitting: Internal Medicine

## 2019-11-08 ENCOUNTER — Other Ambulatory Visit: Payer: Self-pay

## 2019-11-08 ENCOUNTER — Encounter: Payer: Self-pay | Admitting: Internal Medicine

## 2019-11-08 ENCOUNTER — Ambulatory Visit (INDEPENDENT_AMBULATORY_CARE_PROVIDER_SITE_OTHER): Payer: Medicare Other | Admitting: Internal Medicine

## 2019-11-08 VITALS — BP 112/70 | HR 73 | Temp 97.6°F | Ht 68.0 in | Wt 288.0 lb

## 2019-11-08 DIAGNOSIS — I2510 Atherosclerotic heart disease of native coronary artery without angina pectoris: Secondary | ICD-10-CM

## 2019-11-08 DIAGNOSIS — I2583 Coronary atherosclerosis due to lipid rich plaque: Secondary | ICD-10-CM | POA: Diagnosis not present

## 2019-11-08 DIAGNOSIS — Z720 Tobacco use: Secondary | ICD-10-CM | POA: Diagnosis not present

## 2019-11-08 DIAGNOSIS — J449 Chronic obstructive pulmonary disease, unspecified: Secondary | ICD-10-CM | POA: Diagnosis not present

## 2019-11-08 DIAGNOSIS — J9611 Chronic respiratory failure with hypoxia: Secondary | ICD-10-CM

## 2019-11-08 DIAGNOSIS — G4733 Obstructive sleep apnea (adult) (pediatric): Secondary | ICD-10-CM

## 2019-11-08 DIAGNOSIS — R918 Other nonspecific abnormal finding of lung field: Secondary | ICD-10-CM

## 2019-11-08 MED ORDER — LORAZEPAM 0.5 MG PO TABS: ORAL_TABLET | ORAL | 5 refills | Status: DC

## 2019-11-08 MED ORDER — ALBUTEROL SULFATE HFA 108 (90 BASE) MCG/ACT IN AERS: 2.0000 | INHALATION_SPRAY | Freq: Four times a day (QID) | RESPIRATORY_TRACT | 12 refills | Status: DC | PRN

## 2019-11-08 MED ORDER — ALBUTEROL SULFATE (2.5 MG/3ML) 0.083% IN NEBU: INHALATION_SOLUTION | RESPIRATORY_TRACT | 4 refills | Status: AC

## 2019-11-08 NOTE — Progress Notes (Signed)
HPI  female smoker followed for OSA, COPD with history pneumonia, tobacco abuse, Chronic restrictive lung disease, lung nodules, Complicated by HBP, Atopic dermatitis,  NPSG 01/04/03 [place] Severe OSA, RDI.( AHI )32.3/hr. [place] 11/24, weight 270 lbs. PFT 08/26/2011-restriction. FVC 68%, FEV1 62%, FEV1/FVC 0.93. Office Spirometry 02/24/2014-mild obstruction and restriction of exhaled volume. FVC 2.64/72%, FEV1 1.80/63%, FEV1/FVC 0.68, FEF 25-75% 0.92/35% Office Spirometry 10/19/2015-mild obstruction with mild restriction of exhaled volume. FVC 2.62/73%, FEV1 1.92/69%, ratio 0.73, FEF 25-75 percent 1.35/54% CT chest 01/15/17, 04/11/17 Anterior right upper lobe nodule, multiple bilateral small upper lobe nodules Office Spirometry 07/13/18-mild obstruction, mild restriction of FVC.  FVC 2.4/63%, FEV1 1.7/60%, ratio 1.73, FEF 25-75% 1.3/52% ------------------------------------------------------------------------------------------   05/07/2019- 31year-old female Smoker followed for OSA, COPD with history pneumonia, tobacco abuse, Chronic restrictive lung disease, lung nodules, Complicated by HBP, Atopic dermatitis,  CPAP auto 10-20 O2 2 L sleep/Advanced Download compliance 100%, AHI 0.7/ hr Comfortable with CPAP- no concerns. -----pt reports chronic cough & congestion at night and occasionally during the day; states she feels congestion in right side of chest resolved with cough - having a hard time coughing up congestion Body weight today 289 lbs Much stress- mother with Alzheimers has moved in; husband fell after back surgery and back in hospital. Notes "crackle" in upper left chest when lying on left side. Cough clears it temporarily. Sputum clear, no fever.   11/08/19- 51year-old female Smoker followed for OSA, COPD with history pneumonia, tobacco abuse, Chronic restrictive lung disease, lung nodules, Complicated by HBP, Atopic dermatitis, DM2,  CPAP auto 10-20 O2 2 L sleep/[facility] Download  compliance 100%, AHI 0.5/ hr Body weight today - 268 lbs Still smoking against advice 1.5 ppd. She is willing to meet w Pharmacy staff for smoking cessation help. Albuterol hfa, neb albuterol, Symbicort 160,  Says CPAP major help, can't sleep well w/o it. Had Low Dose screening chest CT for nodules outside our system and was told it showed coronary calcification. She asks referral for risk assessment, but denies chest pain. Continues daily Symbicort, using neb several x/ week. Would like to have rescue inhaler- discussed.  Walking limited by dyspnea and arthralgias. Asks renewal of [place] parking. Much stress related to mother living with her who has Alzheimers, and husband on dialysis. Asks refill ativan for cautious use- discussed. CXR 05/07/2019- Stable since 2018.  No acute cardiopulmonary abnormality.   ROS-see HPI  + = positive Constitutional:   No-   weight loss, night sweats, fevers, chills, fatigue, lassitude. HEENT:   No-  headaches, difficulty swallowing, tooth/dental problems, sore throat,       No-  sneezing, itching, ear ache, nasal congestion, post nasal drip,  CV:  No-   chest pain, orthopnea, PND, swelling in lower extremities, anasarca, dizziness, palpitations Resp: +  shortness of breath with exertion or at rest.              + productive cough,  +non-productive cough,  No- coughing up of blood.              No-   change in color of mucus.  No- wheezing.   Skin: No-   rash or lesions. GI:  No-   heartburn, indigestion, abdominal pain, nausea, vomiting, GU: MS:  + joint pain or swelling.  Neuro-     + chronic tremor Psych:  No- change in mood or affect. + depression or anxiety.  No memory loss.  OBJ- Physical Exam   + odor tobacco General- Alert, Oriented,  Affect + anxious, Distress- none acute, +obese,  Skin- rash-none, lesions- none, excoriation- none.  Lymphadenopathy- none Head- atraumatic            Eyes- Gross vision intact, PERRLA, conjunctivae and secretions  clear, strabismus            Ears- Hearing, canals-normal            Nose- Clear, no-Septal dev, mucus, polyps, erosion, perforation             Throat- Mallampati II-III , mucosa clear , drainage- none, tonsils- atrophic.+ Mild hoarseness Neck- flexible , trachea midline, no stridor , thyroid nl, carotid no bruit Chest - symmetrical excursion , unlabored           Heart/CV- RRR , no murmur , no gallop  , no rub, nl s1 s2                           - JVD- none , edema R>L trace, stasis changes- none, varices- none           Lung- + diminished but clear, wheeze- none, cough+loose, dullness-none, rub- none           Chest wall-  Abd-  Br/ Gen/ Rectal- Not done, not indicated Extrem- cyanosis- none, clubbing, none, atrophy- none, strength- nl Neuro- +resting tremor/ head bob persistent

## 2019-11-08 NOTE — Patient Instructions (Signed)
Scripts sent for Ativan and albuterol rescue inhaler to your local drug store Be very cautious with the Ativan as discussed, to avoid over sedation.  Script sent for neb solution to [facility] mail order  Order- referral to Pharmacy- Smoking Cessation assistance  Handicapped Parking form  Order- cardiology referral- calcification reported on screening CT chest. Risk stratification. She hopes to see Dr Aundra Dubin, who also sees her husband.  Please call if we can help

## 2019-11-09 ENCOUNTER — Encounter: Payer: Self-pay | Admitting: Internal Medicine

## 2019-11-09 DIAGNOSIS — J9611 Chronic respiratory failure with hypoxia: Secondary | ICD-10-CM | POA: Insufficient documentation

## 2019-11-09 DIAGNOSIS — I2510 Atherosclerotic heart disease of native coronary artery without angina pectoris: Secondary | ICD-10-CM | POA: Insufficient documentation

## 2019-11-09 NOTE — Assessment & Plan Note (Signed)
Continue Low dose screening CT program

## 2019-11-09 NOTE — Assessment & Plan Note (Signed)
She reports coronary calcification noted on screening chest CT done through [facility] on order from her PCP. She would like cardiology assessment. Risk factors include tobacco, DM2, obesity. Denies angina or palpitation.  Plan- referral to cardiology for risk stratification. Her husband sees Dr Aundra Dubin, so she hopes she can see  Him.

## 2019-11-09 NOTE — Assessment & Plan Note (Signed)
Continues to need O2 2L through CPAP while slseeping

## 2019-11-09 NOTE — Assessment & Plan Note (Signed)
No acute change. She continues daily Symbicort, neb several day/ week, occasional rescue hfa. Plan- continue meds

## 2019-11-09 NOTE — Assessment & Plan Note (Signed)
Benefits from CPAP with good compliance and control Plan- continue auto 10-20 

## 2019-11-09 NOTE — Assessment & Plan Note (Signed)
Chronic refractory smoker. Willing now to seek support from Pharmacy team Plan- referral for smoking cessation support

## 2019-11-12 ENCOUNTER — Telehealth (HOSPITAL_COMMUNITY): Payer: Self-pay | Admitting: Vascular Surgery

## 2019-11-12 NOTE — Telephone Encounter (Signed)
Left pt message to make new pt appt w/ [facility] 

## 2019-11-16 ENCOUNTER — Other Ambulatory Visit: Payer: Medicare Other

## 2019-11-16 NOTE — Progress Notes (Deleted)
Subjective Patient presents to [facility] and seen by the pharmacist for smoking cessation counseling.      Social History   Tobacco Use  Smoking Status Current Every Day Smoker  . Packs/day: 1.50  . Years: 46.00  . Pack years: 69.00  . Types: Cigarettes  . Start date: 1967  Smokeless Tobacco Never Used  Tobacco Comment   10/09 smoking 1.5 pack/daily     Tobacco Use History  Age when started using tobacco on a daily basis ***.  Type: {Nicotine:3044014::"cigarettes","cigar","pipe","electronic cigarettes","snus"}.  Number of cigarettes per day ***, brand ***.  Smokes first cigarette *** minutes after waking.  {Does/does not:3044014::"Does","Does not"} wake at night to smoke  Triggers include {hx nicotine triggers:311123}.  Quit Attempt History   Most recent quit attempt ***.  Longest time ever been tobacco free ***.  Methods tried in the past include {CHL AMB [facility] MEDICATIONS FOR SMOKING CESSATION:20759}.   Rates IMPORTANCE of quitting tobacco on 1-10 scale of ***.  Rates READINESS of quitting tobacco on 1-10 scale of ***.  Rates CONFIDENCE of quitting tobacco on 1-10 scale of ***.  Motivators to quitting include ***; barriers include {smoking cessation barriers:18118}    Immunization History  Administered Date(s) Administered  . Fluad Quad(high Dose 65+) 05/07/2019  . Influenza Inj Mdck Quad Pf 05/28/2018  . Influenza Nasal 04/28/2010  . Influenza Split 03/29/2012, 04/28/2013, 05/10/2015  . Influenza,inj,Quad PF,6+ Mos 04/12/2015, 05/01/2016  . Influenza-Unspecified 05/19/2014, 05/16/2017  . PFIZER SARS-COV-2 Vaccination 09/06/2019, 10/01/2019  . Pneumococcal Polysaccharide-23 06/21/2002  . Td 09/20/2002  . Tdap 11/01/2010     Assessment and Plan  1. Smoking Cessation   Patient states they are ready to quit smoking.  Patient set quit date of ***.   Discussed smoking cessation agent Chant, Wellbutrin, and nicotine replacement therapies.   Patient is agreeable to ***. Nicotine Patch  Patient counseled on purpose, proper use, and potential adverse effects, including mild itching or redness at the point of application, headache, trouble sleeping, and/or vivid dreams     Patch Schedule for >10 cigarettes daily Weeks 1-6: one 21 mg patch daily Weeks 7-8: one 14 mg patch daily Weeks 9-10: one 7 mg patch daily  Patch Schedule for <10 cigarettes daily Weeks 1 to 6: one nicotine patch (14 mg) daily. I will call and re-assess how you are doing at the end of 6 weeks to see how you are doing on 14 mg patch and if you are ready to decrease dose of patch. Weeks 7-8: one nicotine patch (7 mg) daily  Nicotine Lozenge Patient counseled on purpose, proper use, and potential adverse effects including nausea, hiccups, cough, and heartburn.  Instructed patient to use  2 mg unless the smoke within 30 minutes of waking up in which they should use 4 mg.  Lozenge dosing schedule Weeks 1 to 6: 1 lozenge every 1 to 2 hours (maximum: 5 lozenges every 6 hours; 20 lozenges/day); to increase chances of quitting, use at least 9 lozenges/day during the first 6 weeks Weeks 7 to 9: 1 lozenge every 2 to 4 hours (maximum: 5 lozenges every 6 hours; 20 lozenges/day) Weeks 10 to 12: 1 lozenge every 4 to 8 hours (maximum: 5 lozenges every 6 hours; 20 lozenges/day) Nicotine Gum Patient counseled on purpose, proper use, and potential adverse effects including jaw soreness and upset stomach if swallowing saliva.  Instructed patient to use 4 mg if they smoke a pack a day or more and 2 mg if they smoke less  than a pack a day.  Gum dosing schedule Weeks 1 to 6: Chew 1 piece of gum every 1 to 2 hours (maximum: 24 pieces/day); to increase chances of quitting, chew at least 9 pieces/day during the first 6 weeks Weeks 7 to 9: Chew 1 piece of gum every 2 to 4 hours (maximum: 24 pieces/day) Weeks 10 to 12: Chew 1 piece of gum every 4 to 8 hours (maximum: 24  pieces/day)  Bupropion Initiated bupropion ***. Patient with no PMH of seizures. Patient counseled on purpose, proper use, and potential adverse effects, including insomnia, and potential change in mood.   Varenicline Initiated varenicline titration of 0.5 mg by mouth once daily with food x3 days, then 0.5 mg by mouth twice daily with food x4 days, then 1 mg by mouth twice daily with food thereafter.  CrCL greater than 30 mL/min. Patient counseled on purpose, proper use, and potential adverse effects, including GI upset, and potential change in mood.   Provided information on 1 800-QUIT NOW support program.   Patient is not ready to quit smoking.  Reviewed STAR quit plan and smoking cessation agents Chantix, Wellbutrin, and nicotine replacement therapy.  Schedule follow up appointment ***.  Set goal to decrease by *** cigarettes every week till next appointment.  2. Medication Reconciliation A drug regimen assessment was performed, including review of allergies, interactions, disease-state management, dosing and immunization history. Medications were reviewed with the patient, including name, instructions, indication, goals of therapy, potential side effects, importance of adherence, and safe use.  3. Immunizations Patient is indicated for influenzae, pneumonia, and shingles vaccinations.   This appointment required *** minutes of patient care (this includes precharting, chart review, review of results, face-to-face care, etc.).

## 2019-12-07 ENCOUNTER — Other Ambulatory Visit: Payer: Self-pay

## 2019-12-07 ENCOUNTER — Ambulatory Visit: Payer: Medicare Other | Admitting: Pharmacist

## 2019-12-07 DIAGNOSIS — Z720 Tobacco use: Secondary | ICD-10-CM

## 2019-12-07 NOTE — Patient Instructions (Signed)
Thank you for visiting with the pharmacy team!  Please find a review of what we disscused during your visit today:  Start counting the number of cigarettes you smoke daily  Cut out night time cigarettes when you wake up to go to the restroom  Pick 3-5 alternative activities/habits to do when you crave a cigarette.  At next visit we will assess your progress and revisit setting a quit date and starting nicotine patch/lozenge.

## 2019-12-07 NOTE — Progress Notes (Signed)
Subjective Patient presents to [facility] and seen by the pharmacist for smoking cessation counseling. She was referred by Dr. Annamaria Boots.   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  She has stopped in the past for 11 months but says that it was a war in her head of battling wanting a cigarette. She is not confident in her abilities to quit smoking as she states she has no willpower.   Social History   Tobacco Use  Smoking Status Current Every Day Smoker  . Packs/day: 1.50  . Years: 46.00  . Pack years: 69.00  . Types: Cigarettes  . Start date: 1967  Smokeless Tobacco Never Used  Tobacco Comment   10/09 smoking 1.5 pack/daily     Tobacco Use History  Age when started using tobacco on a daily basis 64 years old.  Type: cigarettes.  Number of cigarettes per day 1 and half pack, brand [facility].  Smokes first cigarette 0 minutes after waking.  Does wake at night to smoke  Triggers include after eating, stress/drama, go longer than an hour or two without a cigarette.  Quit Attempt History   Most recent quit attempt 3 years ago.  Longest time ever been tobacco free 11 months ( she used nicorette gum, tootsie pop, taking deep breathes).  Methods tried in the past include nicotine gum, nasal spray (cause sneezing), patch (blisters), wellbutrin for mood.  She is scared of taking Chantix and has a severe depression..   Motivators to quitting include knowing it is not good for husband and her health, breathing; barriers include fear of failing again.   Immunization History  Administered Date(s) Administered  . Fluad Quad(high Dose 65+) 05/07/2019  . Influenza Inj Mdck Quad Pf 05/28/2018  . Influenza Nasal 04/28/2010  . Influenza Split 03/29/2012, 04/28/2013, 05/10/2015  . Influenza,inj,Quad PF,6+ Mos 04/12/2015, 05/01/2016  . Influenza-Unspecified 05/19/2014, 05/16/2017  . PFIZER SARS-COV-2 Vaccination  09/06/2019, 10/01/2019  . Pneumococcal Polysaccharide-23 06/21/2002  . Td 09/20/2002  . Tdap 11/01/2010     Assessment and Plan  1. Smoking Cessation   Had detailed discussion regarding tobacco history.  Patient is not ready to quit smoking.  Reviewed STAR quit plan and smoking cessation agents Chantix, Wellbutrin, and nicotine replacement therapy.  She is very hesitant to try Wellbutrin of Chantix as she does not want to risk exacerbating her depression/anxiety. She is amenable to trying nicotine patch again along with nicotine lozenge. Scheduled follow up appointment 12/28/19.  Set goal to start counting the number of cigarettes she smokes daily and to eliminate cigarettes she smokes in the middle of the night when she goes to the restroom till next appointment. Patient verbalized understanding and feels these small attainable goals will boost her confidence and she will be ready to set quit date at next appointment.  Provided information on 1 800-QUIT NOW support program.   2. Medication Reconciliation A drug regimen assessment was performed, including review of allergies, interactions, disease-state management, dosing and immunization history. Medications were reviewed with the patient, including name, instructions, indication, goals of therapy, potential side effects, importance of adherence, and safe use.  Patient is not taking her diabetes medications as prescribed due to fear of hypoglycemia after she had an episode where her BG was 47.  She is also not checking her BG due to needing a new battery on her glucometer. Instructed patient to reach out to PCP to discuss.  3. Immunizations Patient is  up to date with influenza and Covid-19 vaccine.  Last Pneumovax-23 vaccine was in 2003.  Recommend repeat Pneumovax-23.  Unable to address at appointment due to time constraints.  PLAN  Start counting the number of cigarettes you smoke daily, Cut out night time cigarettes when you wake up to go  to the restroom, Pick 3-5 alternative activities/habits to do when you crave a cigarette.  Recommend Pneumovax 23 at next office visit with provider.  All questions encouraged and answered.  Encouraged patient to call with any further questions or concerns.  This appointment required 60 minutes of patient care (this includes precharting, chart review, review of results, face-to-face care, etc.).  Mariella Saa, PharmD, [place], [place] Clinical Specialty Pharmacist [phone]  12/07/2019 4:37 PM

## 2019-12-22 DIAGNOSIS — D649 Anemia, unspecified: Secondary | ICD-10-CM | POA: Insufficient documentation

## 2019-12-28 ENCOUNTER — Other Ambulatory Visit: Payer: Medicare Other

## 2019-12-28 NOTE — Progress Notes (Deleted)
Subjective Patient presents to [facility] and seen by the pharmacist for follow-up smoking cessation counseling.  Last appointment with pharmacy team was 12/07/2019.  Patient was not ready to quit smoking at that visit but set goal to count the number of cigarettes she smokes daily and to stop having a cigarette when she wakes up in the middle of the night.  Social History   Tobacco Use  Smoking Status Current Every Day Smoker   Packs/day: 1.50   Years: 46.00   Pack years: 69.00   Types: Cigarettes   Start date: 1967  Smokeless Tobacco Never Used  Tobacco Comment   10/09 smoking 1.5 pack/daily      Immunization History  Administered Date(s) Administered   Fluad Quad(high Dose 65+) 05/07/2019   Influenza Inj Mdck Quad Pf 05/28/2018   Influenza Nasal 04/28/2010   Influenza Split 03/29/2012, 04/28/2013, 05/10/2015   Influenza,inj,Quad PF,6+ Mos 04/12/2015, 05/01/2016   Influenza-Unspecified 05/19/2014, 05/16/2017   PFIZER SARS-COV-2 Vaccination 09/06/2019, 10/01/2019   Pneumococcal Polysaccharide-23 06/21/2002   Td 09/20/2002   Tdap 11/01/2010     Assessment and Plan  1. Smoking Cessation   Patient states they are ready to quit smoking.  Patient set quit date of ***.   Discussed smoking cessation agent Chant, Wellbutrin, and nicotine replacement therapies.  Patient is agreeable to ***. Nicotine Patch  Patient counseled on purpose, proper use, and potential adverse effects, including mild itching or redness at the point of application, headache, trouble sleeping, and/or vivid dreams     Patch Schedule for >10 cigarettes daily Weeks 1-6: one 21 mg patch daily Weeks 7-8: one 14 mg patch daily Weeks 9-10: one 7 mg patch daily  Patch Schedule for <10 cigarettes daily Weeks 1 to 6: one nicotine patch (14 mg) daily. I will call and re-assess how you are doing at the end of 6 weeks to see how you are doing on 14 mg patch and if you are ready to decrease  dose of patch. Weeks 7-8: one nicotine patch (7 mg) daily  Nicotine Lozenge Patient counseled on purpose, proper use, and potential adverse effects including nausea, hiccups, cough, and heartburn.  Instructed patient to use  2 mg unless the smoke within 30 minutes of waking up in which they should use 4 mg.  Lozenge dosing schedule Weeks 1 to 6: 1 lozenge every 1 to 2 hours (maximum: 5 lozenges every 6 hours; 20 lozenges/day); to increase chances of quitting, use at least 9 lozenges/day during the first 6 weeks Weeks 7 to 9: 1 lozenge every 2 to 4 hours (maximum: 5 lozenges every 6 hours; 20 lozenges/day) Weeks 10 to 12: 1 lozenge every 4 to 8 hours (maximum: 5 lozenges every 6 hours; 20 lozenges/day) Nicotine Gum Patient counseled on purpose, proper use, and potential adverse effects including jaw soreness and upset stomach if swallowing saliva.  Instructed patient to use 4 mg if they smoke a pack a day or more and 2 mg if they smoke less than a pack a day.  Gum dosing schedule Weeks 1 to 6: Chew 1 piece of gum every 1 to 2 hours (maximum: 24 pieces/day); to increase chances of quitting, chew at least 9 pieces/day during the first 6 weeks Weeks 7 to 9: Chew 1 piece of gum every 2 to 4 hours (maximum: 24 pieces/day) Weeks 10 to 12: Chew 1 piece of gum every 4 to 8 hours (maximum: 24 pieces/day)  Provided information on 1 800-QUIT NOW support program.  Patient is not ready to quit smoking.  Reviewed STAR quit plan and smoking cessation agents Chantix, Wellbutrin, and nicotine replacement therapy.  Schedule follow up appointment ***.  Set goal to decrease by *** cigarettes every week till next appointment.  2. Medication Reconciliation A drug regimen assessment was performed, including review of allergies, interactions, disease-state management, dosing and immunization history. Medications were reviewed with the patient, including name, instructions, indication, goals of therapy, potential side  effects, importance of adherence, and safe use.  3. Immunizations Patient is up-to-date with annual influenza, Prevnar 13 and COVID-19 vaccines.  Last Pneumovax 23 vaccine was in 2003.  Patient is eligible to receive another dose of Pneumovax 23.  PLAN   This appointment required *** minutes of patient care (this includes precharting, chart review, review of results, face-to-face care, etc.).

## 2019-12-29 ENCOUNTER — Telehealth: Payer: Self-pay | Admitting: Pharmacist

## 2019-12-29 NOTE — Telephone Encounter (Signed)
Received call from patient.  Patient called to apologize for not showing appointment yesterday.  She states that she has issues with insomnia and took medication for sleep and missed her alarm.  Patient reports that she has been successful in eliminating her cigarettes that she smoked in the middle of the night.  Patient states she is just now figured out the best way for her to count the number of cigarettes she smokes daily.  Patient would like to reschedule her appointment.  Patient rescheduled for televisit due to difficulty scheduling an office appointment due to her husband and mother's medical appointments.  Patient scheduled for 6/17 at 3:20 PM.   Mariella Saa, PharmD, BCACP, CPP Clinical Specialty Pharmacist (Rheumatology and Pulmonology)  12/29/2019 3:48 PM

## 2020-01-13 ENCOUNTER — Other Ambulatory Visit: Payer: Medicare Other

## 2020-01-13 ENCOUNTER — Other Ambulatory Visit: Payer: Self-pay

## 2020-01-20 ENCOUNTER — Other Ambulatory Visit: Payer: Self-pay

## 2020-01-20 ENCOUNTER — Ambulatory Visit (HOSPITAL_COMMUNITY)
Admission: RE | Admit: 2020-01-20 | Discharge: 2020-01-20 | Disposition: A | Discharge status: Home / Self Care | Payer: Medicare Other | Source: Ambulatory Visit | Attending: Cardiology | Admitting: Cardiology

## 2020-01-20 VITALS — BP 122/74 | HR 64 | Wt 271.0 lb

## 2020-01-20 DIAGNOSIS — I272 Pulmonary hypertension, unspecified: Secondary | ICD-10-CM

## 2020-01-20 DIAGNOSIS — I700 Atherosclerosis of aorta: Secondary | ICD-10-CM | POA: Insufficient documentation

## 2020-01-20 DIAGNOSIS — I25119 Atherosclerotic heart disease of native coronary artery with unspecified angina pectoris: Secondary | ICD-10-CM | POA: Insufficient documentation

## 2020-01-20 DIAGNOSIS — Z9989 Dependence on other enabling machines and devices: Secondary | ICD-10-CM | POA: Diagnosis not present

## 2020-01-20 DIAGNOSIS — G4733 Obstructive sleep apnea (adult) (pediatric): Secondary | ICD-10-CM | POA: Insufficient documentation

## 2020-01-20 DIAGNOSIS — Z7951 Long term (current) use of inhaled steroids: Secondary | ICD-10-CM | POA: Insufficient documentation

## 2020-01-20 DIAGNOSIS — I10 Essential (primary) hypertension: Secondary | ICD-10-CM | POA: Diagnosis not present

## 2020-01-20 DIAGNOSIS — E119 Type 2 diabetes mellitus without complications: Secondary | ICD-10-CM | POA: Diagnosis not present

## 2020-01-20 DIAGNOSIS — R931 Abnormal findings on diagnostic imaging of heart and coronary circulation: Secondary | ICD-10-CM

## 2020-01-20 DIAGNOSIS — Z7982 Long term (current) use of aspirin: Secondary | ICD-10-CM | POA: Diagnosis not present

## 2020-01-20 DIAGNOSIS — E785 Hyperlipidemia, unspecified: Secondary | ICD-10-CM | POA: Insufficient documentation

## 2020-01-20 DIAGNOSIS — R0600 Dyspnea, unspecified: Secondary | ICD-10-CM | POA: Diagnosis not present

## 2020-01-20 DIAGNOSIS — Z8249 Family history of ischemic heart disease and other diseases of the circulatory system: Secondary | ICD-10-CM | POA: Diagnosis not present

## 2020-01-20 DIAGNOSIS — J449 Chronic obstructive pulmonary disease, unspecified: Secondary | ICD-10-CM | POA: Diagnosis not present

## 2020-01-20 DIAGNOSIS — Z79899 Other long term (current) drug therapy: Secondary | ICD-10-CM | POA: Insufficient documentation

## 2020-01-20 DIAGNOSIS — F17210 Nicotine dependence, cigarettes, uncomplicated: Secondary | ICD-10-CM | POA: Insufficient documentation

## 2020-01-20 DIAGNOSIS — Z7984 Long term (current) use of oral hypoglycemic drugs: Secondary | ICD-10-CM | POA: Insufficient documentation

## 2020-01-20 LAB — BASIC METABOLIC PANEL
Anion gap: 10 (ref 5–15)
BUN: 25 mg/dL — ABNORMAL HIGH (ref 8–23)
CO2: 25 mmol/L (ref 22–32)
Calcium: 9.2 mg/dL (ref 8.9–10.3)
Chloride: 105 mmol/L (ref 98–111)
Creatinine, Ser: 1.35 mg/dL — ABNORMAL HIGH (ref 0.44–1.00)
GFR calc Af Amer: 47 mL/min — ABNORMAL LOW (ref >60)
GFR calc non Af Amer: 41 mL/min — ABNORMAL LOW (ref >60)
Glucose, Bld: 135 mg/dL — ABNORMAL HIGH (ref 70–99)
Potassium: 4.2 mmol/L (ref 3.5–5.1)
Sodium: 140 mmol/L (ref 135–145)

## 2020-01-20 LAB — LIPID PANEL
Cholesterol: 113 mg/dL (ref 0–200)
HDL: 29 mg/dL — ABNORMAL LOW (ref >40)
LDL Cholesterol: 44 mg/dL (ref 0–99)
Total CHOL/HDL Ratio: 3.9 RATIO
Triglycerides: 199 mg/dL — ABNORMAL HIGH (ref <150)
VLDL: 40 mg/dL (ref 0–40)

## 2020-01-20 NOTE — Patient Instructions (Signed)
Routine lab work today. Will notify you of abnormal results  Your provider requests you have an echocardiogram.  Your provider requests you have a cardiac CTA.  (we will call you to schedule)  Follow up in 6 months.

## 2020-01-22 NOTE — Progress Notes (Signed)
PCP: Jefm Petty, MD Cardiology: Dr. Aundra Dubin  64 yo with history of COPD, OSA, hyperlipidemia, and HTN was referred by Dr. Annamaria Boots for evaluation of CAD and dyspnea.  She recently had a noncontrast CT chest done at an outside facility for lung cancer screening given smoking history.  This showed coronary calcification (formal calcium score was not done).  She is also concerned as the report mentioned possible pulmonary hypertension with dilated PA.  She is an active smoker of around 1 ppd.  HTN is controlled with 3 medications.  She has diabetes on metformin and hyperlipidemia on Crestor.   She has dyspnea with moderate exertion that has been slowly progressive.  This has been thought to be due to COPD.  She has atypical chest pain (no particular trigger).  No palpitations or syncope.  No orthopnea.    ECG: NSR, normal  Labs (today): LDL 44, TGs 199, K 4.2, creatinine 1.35  PMH: 1. COPD: Continues to smoke.  2. OSA: Uses CPAP.  3. HTN 4. Hyperlipidemia 5. Type 2 diabetes.   Social History   Socioeconomic History  . Marital status: Married    Spouse name: Not on file  . Number of children: 0  . Years of education: Not on file  . Highest education level: Not on file  Occupational History  . Occupation: disability    Comment: Therapist, sports at [employer]  . Smoking status: Current Every Day Smoker    Packs/day: 1.50    Years: 46.00    Pack years: 69.00    Types: Cigarettes    Start date: 10  . Smokeless tobacco: Never Used  . Tobacco comment: 10/09 smoking 1.5 pack/daily  Substance and Sexual Activity  . Alcohol use: No  . Drug use: No  . Sexual activity: Never  Other Topics Concern  . Not on file  Social History Narrative  . Not on file   Social Determinants of Health   Financial Resource Strain:   . Difficulty of Paying Living Expenses:   Food Insecurity:   . Worried About Charity fundraiser in the Last Year:   . Arboriculturist in the Last Year:    Transportation Needs:   . Film/video editor (Medical):   Marland Kitchen Lack of Transportation (Non-Medical):   Physical Activity:   . Days of Exercise per Week:   . Minutes of Exercise per Session:   Stress:   . Feeling of Stress :   Social Connections:   . Frequency of Communication with Friends and Family:   . Frequency of Social Gatherings with Friends and Family:   . Attends Religious Services:   . Active Member of Clubs or Organizations:   . Attends Archivist Meetings:   Marland Kitchen Marital Status:   Intimate Partner Violence:   . Fear of Current or Ex-Partner:   . Emotionally Abused:   Marland Kitchen Physically Abused:   . Sexually Abused:    Family History  Problem Relation Age of Onset  . Heart attack Mother   . Hypertension Mother   . Lung cancer Father   . COPD Father   . Hypertension Father   . Heart attack Father    ROS: All systems reviewed and negative except as per HPI.   Current Outpatient Medications  Medication Sig Dispense Refill  . albuterol (PROVENTIL) (2.5 MG/3ML) 0.083% nebulizer solution USE 1 VIAL IN NEBULIZER EVERY 6 HOURS AND AS NEEDED. Generic: VENTOLIN 360 mL 4  . albuterol (VENTOLIN  HFA) 108 (90 Base) MCG/ACT inhaler Inhale 2 puffs into the lungs every 6 (six) hours as needed for wheezing or shortness of breath. wheezing 8 g 12  . amLODipine (NORVASC) 5 MG tablet Take 5 mg by mouth daily.    Marland Kitchen aspirin 81 MG tablet Take 81 mg by mouth daily.    Marland Kitchen bismuth subsalicylate (PEPTO BISMOL) 262 MG/15ML suspension Take 30 mLs by mouth every 6 (six) hours as needed.    . budesonide-formoterol (SYMBICORT) 160-4.5 MCG/ACT inhaler Inhale 2 puffs into the lungs 2 (two) times daily.    . chlorthalidone (HYGROTON) 25 MG tablet Take 25 mg by mouth daily.    . Cholecalciferol (VITAMIN D) 2000 units CAPS Take 1 capsule by mouth daily.    . DULoxetine (CYMBALTA) 60 MG capsule Take 60 mg by mouth every other day.     Marland Kitchen glipiZIDE (GLUCOTROL) 5 MG tablet Take 5 mg by mouth daily  before breakfast.     . glucose blood (ONETOUCH ULTRA) test strip CHECK SUGARS ONCE DAILY. DX: E11.40    . HYDROcodone-acetaminophen (NORCO/VICODIN) 5-325 MG tablet Take 1 tablet by mouth every 6 (six) hours as needed.    Marland Kitchen LORazepam (ATIVAN) 0.5 MG tablet 1 twice daily if needed 30 tablet 5  . Magnesium Oxide 500 MG TABS Take by mouth.    . metFORMIN (GLUCOPHAGE) 1000 MG tablet Take 1,000 mg by mouth 2 (two) times daily with a meal.    . methocarbamol (ROBAXIN) 500 MG tablet Take 500 mg by mouth daily as needed.     Marland Kitchen olmesartan (BENICAR) 40 MG tablet Take 40 mg by mouth daily.    Marland Kitchen oxybutynin (DITROPAN-XL) 10 MG 24 hr tablet Take 10 mg by mouth 2 (two) times daily.    . polyethylene glycol (MIRALAX / GLYCOLAX) 17 g packet Take 17 g by mouth daily as needed.    . pregabalin (LYRICA) 100 MG capsule Take 100 mg by mouth 2 (two) times daily. 100mg  tablet in the morning. 200 mg in the evening    . rosuvastatin (CRESTOR) 40 MG tablet Take 1 tablet by mouth daily.    Marland Kitchen spironolactone (ALDACTONE) 25 MG tablet Take 25 mg by mouth at bedtime.    . traZODone (DESYREL) 150 MG tablet Take by mouth at bedtime. Take 1/3 to 1 tablet at bedtime as needed.     No current facility-administered medications for this encounter.   BP 122/74   Pulse 64   Wt 122.9 kg (271 lb)   SpO2 96%   BMI 41.21 kg/m  General: NAD Neck: No JVD, no thyromegaly or thyroid nodule.  Lungs: Mildly distant BS.  CV: Nondisplaced PMI.  Heart regular S1/S2, no S3/S4, no murmur.  No peripheral edema.  No carotid bruit.  Normal pedal pulses.  Abdomen: Soft, nontender, no hepatosplenomegaly, no distention.  Skin: Intact without lesions or rashes.  Neurologic: Alert and oriented x 3.  Psych: Normal affect. Extremities: No clubbing or cyanosis.  HEENT: Normal.   1. CAD: Patient had coronary calcification on a noncontrast CT chest. She has exertional dyspnea that may be related to COPD, also atypical chest pain.  She does have  multiple RFs for CAD => hyperlipidemia, HTN, smoking, and diabetes. ECG is normal.  - Needs aggressive RF control.   - Continue ASA 81 daily.  - Continue Crestor 40, today's LDL < 70 at goal.  - Given exertional symptoms, will arrange for coronary CT angiogram to assess for obstructive CAD.  2. HTN:  Under good control on current regimen.  3. Hyperlipidemia: Under good control on current regimen based on today's labs.  4. Smoking: Urged her to quit.  She is working with pulmonary.  5. OSA: Continue CPAP.  6. ?Pulmonary hypertension: There is a question of pulmonary hypertension based on dilated PA on CT chest. This certainly could be present related to COPD.  - I will arrange for echo for noninvasive evaluation of PA pressure and RV.   Loralie Champagne 01/22/2020

## 2020-02-03 ENCOUNTER — Other Ambulatory Visit: Payer: Self-pay

## 2020-02-03 ENCOUNTER — Ambulatory Visit (HOSPITAL_COMMUNITY)
Admission: RE | Admit: 2020-02-03 | Discharge: 2020-02-03 | Disposition: A | Discharge status: Home / Self Care | Payer: Medicare Other | Source: Ambulatory Visit | Attending: Cardiology | Admitting: Cardiology

## 2020-02-03 DIAGNOSIS — I10 Essential (primary) hypertension: Secondary | ICD-10-CM | POA: Insufficient documentation

## 2020-02-03 DIAGNOSIS — E785 Hyperlipidemia, unspecified: Secondary | ICD-10-CM | POA: Diagnosis not present

## 2020-02-03 DIAGNOSIS — I272 Pulmonary hypertension, unspecified: Secondary | ICD-10-CM | POA: Insufficient documentation

## 2020-02-03 NOTE — Progress Notes (Signed)
  Echocardiogram 2D Echocardiogram has been performed.  Jennette Dubin 02/03/2020, 3:59 PM

## 2020-02-08 ENCOUNTER — Encounter (HOSPITAL_COMMUNITY): Payer: Self-pay

## 2020-02-09 ENCOUNTER — Other Ambulatory Visit: Payer: Self-pay

## 2020-02-09 ENCOUNTER — Ambulatory Visit (INDEPENDENT_AMBULATORY_CARE_PROVIDER_SITE_OTHER): Payer: Medicare Other | Admitting: Pharmacist

## 2020-02-09 DIAGNOSIS — F17210 Nicotine dependence, cigarettes, uncomplicated: Secondary | ICD-10-CM

## 2020-02-09 DIAGNOSIS — Z720 Tobacco use: Secondary | ICD-10-CM

## 2020-02-09 NOTE — Progress Notes (Signed)
   Subjective Patient presents to [facility] and seen by the pharmacist for smoking cessation counseling.   She is still under a lot of stress but it has improved since last visit.  She has not been able to decrease the amount she smokes but has decreased the number of nights she wakes to smoke.  Social History   Tobacco Use  Smoking Status Current Every Day Smoker  . Packs/day: 1.50  . Years: 46.00  . Pack years: 69.00  . Types: Cigarettes  . Start date: 1967  Smokeless Tobacco Never Used  Tobacco Comment   10/09 smoking 1.5 pack/daily      Immunization History  Administered Date(s) Administered  . Fluad Quad(high Dose 65+) 05/07/2019  . Influenza Inj Mdck Quad Pf 05/01/2017, 05/28/2018  . Influenza Nasal 04/28/2010  . Influenza Split 03/29/2012, 04/28/2013, 05/10/2015  . Influenza,inj,Quad PF,6+ Mos 04/12/2015, 05/01/2016  . Influenza-Unspecified 05/19/2014, 05/16/2017  . PFIZER SARS-COV-2 Vaccination 09/06/2019, 10/01/2019  . Pneumococcal Conjugate-13 05/24/2019  . Pneumococcal Polysaccharide-23 06/21/2002  . Td 09/20/2002  . Tdap 11/01/2010     Assessment and Plan  1. Smoking Cessation Follow-Up  At last visit patient was not ready to quit smoking.  Set goal to count number of cigarettes daily, quit smoking in the middle of the night, and pick 3 alternative activities to do instead of smoking when a craving occurs.  Patient states she was able to count the number she smokes for 1 day and it was about a pack and a half.  She was happy as she thought she smoked closer to 2 packs.  She is no longer counting.  She has decreased the number of evenings she wakes in the middle of the night to smoke but has not cut out completely.  She forgot to pick alternative activities.  Asked patient if she is ready to set a quit date.  She is not ready to quit smoking.  She would like to revisit quitting smoking and trying nicotine patch plus lozenges at a later time.  Will  call patient on July 29th after 3 pm for follow up.  All questions encouraged and answered.  Instructed patient to call with any questions or concerns.  This appointment required 25 minutes of patient care (this includes precharting, chart review, review of results, face-to-face care, etc.).   Mariella Saa, PharmD, [place], CPP Clinical Specialty Pharmacist (Rheumatology and Pulmonology)  02/09/2020 3:51 PM

## 2020-02-28 ENCOUNTER — Telehealth: Payer: Self-pay | Admitting: Pharmacist

## 2020-02-28 NOTE — Telephone Encounter (Signed)
Contacted patient to discuss smoking cessation progress. Patient expressed that she is going through a high level of stress with her husband's and mother's health. Husband is currently in the hospital and will now need to go to a nursing home/rehab facility and her mother has cancer in her ear. Patient reports she has been unable to focus on smoking cessation due to this stress and will like [place] to give her a call in 1 month in hopes that her stress level has decreased.   Lorel Monaco, PharmD PGY2 Ambulatory Care Resident [place]

## 2020-03-13 DIAGNOSIS — M47816 Spondylosis without myelopathy or radiculopathy, lumbar region: Secondary | ICD-10-CM | POA: Insufficient documentation

## 2020-03-15 ENCOUNTER — Telehealth (HOSPITAL_COMMUNITY): Payer: Self-pay | Admitting: Vascular Surgery

## 2020-03-15 NOTE — Telephone Encounter (Signed)
Left pt VM , giving cardiac CT APPT 8/26 @ 2:30PM, LEFT PT DETAILED MESSAGE W/ INTRUCTIONS, ASKED PT TO CALL BACK TO CONFIRM APPT

## 2020-03-21 ENCOUNTER — Telehealth (HOSPITAL_COMMUNITY): Payer: Self-pay | Admitting: *Deleted

## 2020-03-21 NOTE — Telephone Encounter (Signed)
Attempted to call patient regarding upcoming cardiac CT appointment. Left message on voicemail with name and callback number  Broderick Fonseca Tai RN Navigator Cardiac Imaging [facility] [phone] Office [phone] Cell  

## 2020-03-22 ENCOUNTER — Telehealth (HOSPITAL_COMMUNITY): Payer: Self-pay | Admitting: Emergency Medicine

## 2020-03-22 DIAGNOSIS — J984 Other disorders of lung: Secondary | ICD-10-CM

## 2020-03-22 DIAGNOSIS — I2510 Atherosclerotic heart disease of native coronary artery without angina pectoris: Secondary | ICD-10-CM

## 2020-03-22 MED ORDER — METOPROLOL TARTRATE 50 MG PO TABS: 50.0000 mg | ORAL_TABLET | Freq: Once | ORAL | 0 refills | Status: DC

## 2020-03-22 NOTE — Telephone Encounter (Signed)
Reaching out to patient to offer assistance regarding upcoming cardiac imaging study; pt verbalizes understanding of appt date/time, parking situation and where to check in, pre-test NPO status and medications ordered, and verified current allergies; name and call back number provided for further questions should they arise Marchia Bond RN Navigator Cardiac Imaging [facility] [phone] office [phone] cell   Informed patient to arrive to admitting at 1pm for infusion clinic-IV hydration.  Also informed her that I will be submitting a Rx for 50mg  metoprolol for her to take 2 hr prior to scan for HR control. Pt verified her pharmacy and verbalized understanding of instructions.

## 2020-03-23 ENCOUNTER — Other Ambulatory Visit: Payer: Self-pay

## 2020-03-23 ENCOUNTER — Ambulatory Visit (HOSPITAL_COMMUNITY)
Admission: RE | Admit: 2020-03-23 | Discharge: 2020-03-23 | Disposition: A | Discharge status: Home / Self Care | Payer: Medicare Other | Source: Ambulatory Visit | Attending: Cardiology | Admitting: Cardiology

## 2020-03-23 ENCOUNTER — Encounter (HOSPITAL_COMMUNITY)
Admission: RE | Admit: 2020-03-23 | Discharge: 2020-03-23 | Disposition: A | Discharge status: Home / Self Care | Payer: Medicare Other | Source: Ambulatory Visit | Attending: Cardiology | Admitting: Cardiology

## 2020-03-23 DIAGNOSIS — I272 Pulmonary hypertension, unspecified: Secondary | ICD-10-CM | POA: Diagnosis not present

## 2020-03-23 DIAGNOSIS — R931 Abnormal findings on diagnostic imaging of heart and coronary circulation: Secondary | ICD-10-CM | POA: Insufficient documentation

## 2020-03-23 DIAGNOSIS — R079 Chest pain, unspecified: Secondary | ICD-10-CM | POA: Insufficient documentation

## 2020-03-23 LAB — BASIC METABOLIC PANEL
Anion gap: 8 (ref 5–15)
BUN: 25 mg/dL — ABNORMAL HIGH (ref 8–23)
CO2: 24 mmol/L (ref 22–32)
Calcium: 9.3 mg/dL (ref 8.9–10.3)
Chloride: 107 mmol/L (ref 98–111)
Creatinine, Ser: 1.38 mg/dL — ABNORMAL HIGH (ref 0.44–1.00)
GFR calc Af Amer: 46 mL/min — ABNORMAL LOW (ref >60)
GFR calc non Af Amer: 40 mL/min — ABNORMAL LOW (ref >60)
Glucose, Bld: 125 mg/dL — ABNORMAL HIGH (ref 70–99)
Potassium: 5.4 mmol/L — ABNORMAL HIGH (ref 3.5–5.1)
Sodium: 139 mmol/L (ref 135–145)

## 2020-03-23 MED ORDER — SODIUM CHLORIDE 0.9 % WEIGHT BASED INFUSION
3.0000 mL/kg/h | INTRAVENOUS | Status: DC
  Administered 2020-03-23: 3 mL/kg/h via INTRAVENOUS

## 2020-03-23 MED ORDER — NITROGLYCERIN 0.4 MG SL SUBL
SUBLINGUAL_TABLET | SUBLINGUAL | Status: AC
  Filled 2020-03-23: qty 2

## 2020-03-23 MED ORDER — SODIUM CHLORIDE 0.9 % WEIGHT BASED INFUSION
1.0000 mL/kg/h | INTRAVENOUS | Status: DC
  Administered 2020-03-23: 1.004 mL/kg/h via INTRAVENOUS

## 2020-03-23 MED ORDER — NITROGLYCERIN 0.4 MG SL SUBL
0.8000 mg | SUBLINGUAL_TABLET | Freq: Once | SUBLINGUAL | Status: AC
  Administered 2020-03-23: 0.8 mg via SUBLINGUAL

## 2020-03-23 MED ORDER — IOHEXOL 350 MG/ML SOLN
80.0000 mL | Freq: Once | INTRAVENOUS | Status: AC | PRN
  Administered 2020-03-23: 80 mL via INTRAVENOUS

## 2020-03-24 DIAGNOSIS — Z6841 Body Mass Index (BMI) 40.0 and over, adult: Secondary | ICD-10-CM | POA: Diagnosis not present

## 2020-03-24 DIAGNOSIS — I272 Pulmonary hypertension, unspecified: Secondary | ICD-10-CM | POA: Diagnosis not present

## 2020-03-24 DIAGNOSIS — R931 Abnormal findings on diagnostic imaging of heart and coronary circulation: Secondary | ICD-10-CM | POA: Diagnosis not present

## 2020-03-27 ENCOUNTER — Telehealth: Payer: Self-pay | Admitting: Pharmacist

## 2020-03-27 DIAGNOSIS — Z720 Tobacco use: Secondary | ICD-10-CM

## 2020-03-27 NOTE — Telephone Encounter (Signed)
Contacted patient to discuss smoking cessation progress, however patient did not answer. Left HIPAA compliant voicemail to contact clinical pharmacist when available.  Elaysha Bevard, PharmD PGY2 Ambulatory Care Resident [place]  [facility]  

## 2020-04-04 ENCOUNTER — Other Ambulatory Visit (HOSPITAL_COMMUNITY): Payer: Medicare Other

## 2020-04-04 ENCOUNTER — Other Ambulatory Visit (HOSPITAL_COMMUNITY): Payer: Self-pay

## 2020-04-04 DIAGNOSIS — I272 Pulmonary hypertension, unspecified: Secondary | ICD-10-CM

## 2020-04-04 NOTE — Progress Notes (Signed)
Orders Placed This Encounter  Procedures  . Basic Metabolic Panel (BMET)    Standing Status:   Future    Standing Expiration Date:   04/04/2021    Order Specific Question:   Release to patient    Answer:   Immediate

## 2020-04-17 ENCOUNTER — Telehealth: Payer: Self-pay | Admitting: Pharmacist

## 2020-04-17 DIAGNOSIS — Z720 Tobacco use: Secondary | ICD-10-CM

## 2020-04-17 NOTE — Telephone Encounter (Signed)
Contacted patient to discuss smoking cessation progress, however patient did not answer. Left HIPAA compliant voicemail to contact clinical pharmacist when available.  Miosha Behe, PharmD PGY2 Ambulatory Care Resident [place]  [facility]  

## 2020-05-12 ENCOUNTER — Telehealth (HOSPITAL_COMMUNITY): Payer: Self-pay

## 2020-05-12 NOTE — Telephone Encounter (Signed)
-----   Message from Larey Dresser, MD sent at 05/10/2020  2:49 PM EDT ----- No evidence for hemodynamically significant stenosis.

## 2020-05-12 NOTE — Telephone Encounter (Signed)
Malena Edman, RN  05/12/2020 2:13 PM EDT Back to Top    Patient aware and verbalized understanding   Malena Edman, RN  05/10/2020 3:51 PM EDT     Left message to return call

## 2020-09-13 ENCOUNTER — Other Ambulatory Visit: Payer: Self-pay

## 2020-09-13 ENCOUNTER — Encounter: Payer: Self-pay | Admitting: Podiatry

## 2020-09-13 ENCOUNTER — Ambulatory Visit (INDEPENDENT_AMBULATORY_CARE_PROVIDER_SITE_OTHER): Payer: Medicare Other | Admitting: Podiatry

## 2020-09-13 DIAGNOSIS — M79675 Pain in left toe(s): Secondary | ICD-10-CM | POA: Diagnosis not present

## 2020-09-13 DIAGNOSIS — M79674 Pain in right toe(s): Secondary | ICD-10-CM | POA: Diagnosis not present

## 2020-09-13 DIAGNOSIS — L600 Ingrowing nail: Secondary | ICD-10-CM

## 2020-09-13 DIAGNOSIS — B351 Tinea unguium: Secondary | ICD-10-CM

## 2020-09-13 NOTE — Patient Instructions (Signed)

## 2020-09-14 NOTE — Progress Notes (Signed)
Subjective:   Patient ID: [name] [name], female   DOB: 65 y.o.   MRN: [id number]   HPI Patient presents for debridement of nailbeds and states the right big toenail needs to be removed as it is gotten thick and she has a lot of pain with it and cannot do anything with it currently.  Patient smokes a pack and a half of cigarettes per day but seems to do well and has been counseled about stopping smoking and is not significantly active   Review of Systems  All other systems reviewed and are negative.       Objective:  Physical Exam Vitals and nursing note reviewed.  Constitutional:      Appearance: She is well-developed and well-nourished.  Cardiovascular:     Pulses: Intact distal pulses.  Pulmonary:     Effort: Pulmonary effort is normal.  Musculoskeletal:        General: Normal range of motion.  Skin:    General: Skin is warm.  Neurological:     Mental Status: She is alert.     Neurovascular status was found to be intact muscle strength is found to be adequate range of motion adequate.  Patient was noted to have thickened elongated nailbeds 1-5 right 2-5 left and the right hallux nail is very thick dystrophic and painful when pressed but dorsal direction with mild pain in the remaining nails.  Patient was noted to have good digital perfusion and is well oriented x3     Assessment:  Damaged right hallux nail with pain with history of removal of the left nail that did well with other nails that are bothersome for the patient     Plan:  H&P reviewed conditions discussed treatment options.  I have recommended nail removal for the right I explained procedure risk and she signed consent form.  I infiltrated the right hallux 60 mg like Marcaine mixture sterile prep done and using sterile instrumentation remove the hallux nail exposed matrix applied phenol 5 applications 30 seconds followed by alcohol lavage and sterile dressing.  Gave instructions for soaks and I then debrided remaining  nails 2 through 5 bilateral with no iatrogenic bleeding and patient is encouraged to call with any questions concerns which may arise during the healing process

## 2020-09-21 ENCOUNTER — Telehealth: Payer: Self-pay | Admitting: Podiatry

## 2020-09-21 NOTE — Telephone Encounter (Signed)
Pt called worried about her toe due to her being a diabetic. She states it's red and swollen, but isn't hot or painful. Please advise.

## 2020-09-22 NOTE — Telephone Encounter (Signed)
Watch it and if it is getting worse come in

## 2020-11-07 NOTE — Progress Notes (Signed)
HPI  female smoker followed for OSA, COPD with history pneumonia, tobacco abuse, Chronic restrictive lung disease, lung nodules, Complicated by HBP, Atopic dermatitis,  NPSG 01/04/03 [place] Severe OSA, RDI.( AHI )32.3/hr. [place] 11/24, weight 270 lbs. PFT 08/26/2011-restriction. FVC 68%, FEV1 62%, FEV1/FVC 0.93. Office Spirometry 02/24/2014-mild obstruction and restriction of exhaled volume. FVC 2.64/72%, FEV1 1.80/63%, FEV1/FVC 0.68, FEF 25-75% 0.92/35% Office Spirometry 10/19/2015-mild obstruction with mild restriction of exhaled volume. FVC 2.62/73%, FEV1 1.92/69%, ratio 0.73, FEF 25-75 percent 1.35/54% CT chest 01/15/17, 04/11/17 Anterior right upper lobe nodule, multiple bilateral small upper lobe nodules Office Spirometry 07/13/18-mild obstruction, mild restriction of FVC.  FVC 2.4/63%, FEV1 1.7/60%, ratio 1.73, FEF 25-75% 1.3/52% ------------------------------------------------------------------------------------------   11/08/19- 71year-old female Smoker followed for OSA, COPD with history pneumonia, tobacco abuse, Chronic restrictive lung disease, lung nodules, Complicated by HBP, Atopic dermatitis, DM2,  CPAP auto 10-20 O2 2 L sleep/Adapt Download compliance 100%, AHI 0.5/ hr Body weight today - 268 lbs Still smoking against advice 1.5 ppd. She is willing to meet w Pharmacy staff for smoking cessation help. Albuterol hfa, neb albuterol, Symbicort 160,  Says CPAP major help, can't sleep well w/o it. Had Low Dose screening chest CT for nodules outside our system and was told it showed coronary calcification. She asks referral for risk assessment, but denies chest pain. Continues daily Symbicort, using neb several x/ week. Would like to have rescue inhaler- discussed.  Walking limited by dyspnea and arthralgias. Asks renewal of [place] parking. Much stress related to mother living with her who has Alzheimers, and husband on dialysis. Asks refill ativan for cautious use- discussed. CXR  05/07/2019- Stable since 2018.  No acute cardiopulmonary abnormality.  11/08/20- 41year-old female Smoker(69 pk yrs) followed for OSA, COPD with history pneumonia, tobacco abuse, Chronic restrictive lung disease, lung nodules, Complicated by HBP, Atopic dermatitis, DM2,  -neb albuterol, Ventolin hfa, Symbicort 160,  CPAP auto 10-20 O2 2 L sleep/Adapt Download-compliance 97%, AHI 0.6./hr Body weight-265 lbs Covid vax-3 Phizer Flu vax-had -----Patient states she had COPD exacerbation and is still taking prednisone and has finished zpak. States her breathing is still "rough" dry cough. Needs refill on Symbicort. Patient is not sleeping good states its because her husband passed in September. Machine is working good. Patient asking for CT. Reviewed Download. Comfortable with her CPAP.  Had sinus infection w COPD exacerbation treated at [facility]. Residual congested cough and sore throat, no longer productive. Had CXR at [facility] w no special comment reported to her.  She would like to start low dose CT screening.  ROS-see HPI  + = positive Constitutional:   No-   weight loss, night sweats, fevers, chills, fatigue, lassitude. HEENT:   No-  headaches, difficulty swallowing, tooth/dental problems, +sore throat,       No-  sneezing, itching, ear ache, nasal congestion, post nasal drip,  CV:  No-   chest pain, orthopnea, PND, swelling in lower extremities, anasarca, dizziness, palpitations Resp: +  shortness of breath with exertion or at rest.               productive cough,  +non-productive cough,  No- coughing up of blood.              No-   change in color of mucus.  No- wheezing.   Skin: No-   rash or lesions. GI:  No-   heartburn, indigestion, abdominal pain, nausea, vomiting, GU: MS:  + joint pain or swelling.  Neuro-     + chronic  tremor Psych:  No- change in mood or affect. + depression or anxiety.  No memory loss.  OBJ- Physical Exam   + odor tobacco General- Alert, Oriented, Affect + anxious,  Distress- none acute, +obese,  Skin- rash-none, lesions- none, excoriation- none.  Lymphadenopathy- none Head- atraumatic            Eyes- Gross vision intact, PERRLA, conjunctivae and secretions clear, strabismus            Ears- Hearing, canals-normal            Nose- Clear, no-Septal dev, mucus, polyps, erosion, perforation             Throat- Mallampati II-III , mucosa clear , drainage- none, tonsils- atrophic.+ Mild hoarseness Neck- flexible , trachea midline, no stridor , thyroid nl, carotid no bruit Chest - symmetrical excursion , unlabored           Heart/CV- RRR , no murmur , no gallop  , no rub, nl s1 s2                           - JVD- none , edema R>L trace, stasis changes- none, varices- none           Lung- + diminished but clear, wheeze- none, cough+light, dullness-none, rub- none           Chest wall-  Abd-  Br/ Gen/ Rectal- Not done, not indicated Extrem- cyanosis- none, clubbing, none, atrophy- none, strength- nl Neuro- +resting tremor/ head bob persistent

## 2020-11-08 ENCOUNTER — Other Ambulatory Visit: Payer: Self-pay

## 2020-11-08 ENCOUNTER — Ambulatory Visit (INDEPENDENT_AMBULATORY_CARE_PROVIDER_SITE_OTHER): Payer: Medicare Other | Admitting: Internal Medicine

## 2020-11-08 ENCOUNTER — Encounter: Payer: Self-pay | Admitting: Internal Medicine

## 2020-11-08 VITALS — BP 130/70 | HR 67 | Temp 97.4°F | Ht 68.5 in | Wt 265.4 lb

## 2020-11-08 DIAGNOSIS — J449 Chronic obstructive pulmonary disease, unspecified: Secondary | ICD-10-CM | POA: Diagnosis not present

## 2020-11-08 DIAGNOSIS — Z720 Tobacco use: Secondary | ICD-10-CM

## 2020-11-08 DIAGNOSIS — G4733 Obstructive sleep apnea (adult) (pediatric): Secondary | ICD-10-CM | POA: Diagnosis not present

## 2020-11-08 MED ORDER — ALBUTEROL SULFATE HFA 108 (90 BASE) MCG/ACT IN AERS: 2.0000 | INHALATION_SPRAY | Freq: Four times a day (QID) | RESPIRATORY_TRACT | 12 refills | Status: DC | PRN

## 2020-11-08 MED ORDER — BUDESONIDE-FORMOTEROL FUMARATE 160-4.5 MCG/ACT IN AERO: 2.0000 | INHALATION_SPRAY | Freq: Two times a day (BID) | RESPIRATORY_TRACT | 12 refills | Status: DC

## 2020-11-08 NOTE — Patient Instructions (Addendum)
Order- referral to start low dose CT chest screening dx COPD mixed type, Tobacco user  Refills sent for Symbicort and Ventolin rescue inhaler  Please call if we can help  Handicapped Parking

## 2020-11-15 ENCOUNTER — Telehealth: Payer: Self-pay | Admitting: Acute Care

## 2020-11-15 DIAGNOSIS — Z87891 Personal history of nicotine dependence: Secondary | ICD-10-CM

## 2020-11-15 DIAGNOSIS — F17210 Nicotine dependence, cigarettes, uncomplicated: Secondary | ICD-10-CM

## 2020-11-21 NOTE — Telephone Encounter (Signed)
Spoke with pt and scheduled SDMV 01/03/21 2:00 CT ordered Nothing further needed

## 2020-12-24 ENCOUNTER — Other Ambulatory Visit: Payer: Self-pay

## 2020-12-24 ENCOUNTER — Emergency Department (HOSPITAL_COMMUNITY)
Admission: EM | Admit: 2020-12-24 | Discharge: 2020-12-24 | Disposition: A | Discharge status: Home / Self Care | Payer: Medicare Other | Attending: Emergency Medicine | Admitting: Emergency Medicine

## 2020-12-24 DIAGNOSIS — Z7982 Long term (current) use of aspirin: Secondary | ICD-10-CM | POA: Diagnosis not present

## 2020-12-24 DIAGNOSIS — J449 Chronic obstructive pulmonary disease, unspecified: Secondary | ICD-10-CM | POA: Insufficient documentation

## 2020-12-24 DIAGNOSIS — I2510 Atherosclerotic heart disease of native coronary artery without angina pectoris: Secondary | ICD-10-CM | POA: Diagnosis not present

## 2020-12-24 DIAGNOSIS — E1140 Type 2 diabetes mellitus with diabetic neuropathy, unspecified: Secondary | ICD-10-CM | POA: Diagnosis not present

## 2020-12-24 DIAGNOSIS — R21 Rash and other nonspecific skin eruption: Secondary | ICD-10-CM | POA: Diagnosis not present

## 2020-12-24 DIAGNOSIS — Z79899 Other long term (current) drug therapy: Secondary | ICD-10-CM | POA: Diagnosis not present

## 2020-12-24 DIAGNOSIS — I129 Hypertensive chronic kidney disease with stage 1 through stage 4 chronic kidney disease, or unspecified chronic kidney disease: Secondary | ICD-10-CM | POA: Diagnosis not present

## 2020-12-24 DIAGNOSIS — N183 Chronic kidney disease, stage 3 unspecified: Secondary | ICD-10-CM | POA: Diagnosis not present

## 2020-12-24 DIAGNOSIS — F17210 Nicotine dependence, cigarettes, uncomplicated: Secondary | ICD-10-CM | POA: Diagnosis not present

## 2020-12-24 DIAGNOSIS — Z7984 Long term (current) use of oral hypoglycemic drugs: Secondary | ICD-10-CM | POA: Diagnosis not present

## 2020-12-24 DIAGNOSIS — E1122 Type 2 diabetes mellitus with diabetic chronic kidney disease: Secondary | ICD-10-CM | POA: Insufficient documentation

## 2020-12-24 DIAGNOSIS — T7840XA Allergy, unspecified, initial encounter: Secondary | ICD-10-CM | POA: Insufficient documentation

## 2020-12-24 MED ORDER — PREDNISONE 20 MG PO TABS
60.0000 mg | ORAL_TABLET | Freq: Once | ORAL | Status: AC
  Administered 2020-12-24: 60 mg via ORAL
  Filled 2020-12-24: qty 3

## 2020-12-24 MED ORDER — HYDROXYZINE HCL 25 MG PO TABS: 25.0000 mg | ORAL_TABLET | Freq: Four times a day (QID) | ORAL | 0 refills | Status: DC

## 2020-12-24 MED ORDER — PREDNISONE 20 MG PO TABS: ORAL_TABLET | ORAL | 0 refills | Status: DC

## 2020-12-24 NOTE — ED Triage Notes (Signed)
Pt came in with c/o rash times a few days. Pt states it started in her R leg. Noted rash generalized. Pt is c/o itching and has taken Benadryl for same

## 2020-12-24 NOTE — Discharge Instructions (Addendum)
Take either the benadryl or hydroxyzine, do not double them.  Follow up with your family doc.

## 2020-12-24 NOTE — ED Provider Notes (Signed)
[facility] DEPT Provider Note   CSN: [id number] Arrival date & time: 12/24/20  5945     History Chief Complaint  Patient presents with  . Rash    [name] [name] is a 65 y.o. female.  65 yo F with a cc of an itchy rash.  Going on for about 12 to 24 hours.  Diffuse about the trunk and face and extremities.  She denies any new soaps or detergents.  She is currently on an antibiotic that she started a few days ago after a dental extraction.  She thinks it is clindamycin.  Says she is about 80% sure that is what it is.  Denies any other new medicines.  Denies difficulty breathing or swallowing denies wheezing denies nausea vomiting or diarrhea denies feeling like she may pass out.  The history is provided by the patient.  Rash Location:  Full body Quality: itchiness and redness   Severity:  Severe Onset quality:  Gradual Duration:  1 day Timing:  Constant Progression:  Worsening Chronicity:  New Context: medications   Context: not new detergent/soap   Relieved by:  Nothing Worsened by:  Nothing Ineffective treatments:  None tried Associated symptoms: no fever, no headaches, no joint pain, no myalgias, no nausea, no shortness of breath, not vomiting and not wheezing        Past Medical History:  Diagnosis Date  . Anxiety   . COPD (chronic obstructive pulmonary disease) ([place])   . Depression   . Hyperlipidemia   . Hypertension   . Restrictive lung disease   . Sleep apnea     Patient Active Problem List   Diagnosis Date Noted  . Lumbar facet arthropathy 03/13/2020  . Normocytic anemia 12/22/2019  . Chronic respiratory failure with hypoxia ([place]) 11/09/2019  . CAD (coronary artery disease) 11/09/2019  . Encounter for screening for malignant neoplasm of lung in current smoker with 30 pack year history or greater 06/18/2019  . GAD (generalized anxiety disorder) 06/18/2019  . Cervical radiculopathy 04/20/2018  . Chronic bilateral low back pain  with left-sided sciatica 04/20/2018  . Trigeminal neuralgia 04/20/2018  . 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 Low magnesium level 10/16/2017  . Low serum potassium level 10/16/2017  . Acute medial meniscus tear of left knee 08/08/2017  . Chronic knee pain 07/30/2017  . Radiodense bone lesion present on x-ray 07/30/2017  . Lung nodules 04/18/2017  . Tobacco dependence due to cigarettes 02/18/2017  . Vitamin D deficiency 08/08/2016  . Bilateral edema of lower extremity 03/05/2016  . Thoracic radiculitis 10/18/2015  . Family history of colonic polyps 10/12/2015  . BMI 40.0-44.9, adult ([place]) 10/11/2015  . History of adenomatous polyp of colon 10/11/2015  . Chronic thoracic back pain 09/16/2015  . Lipoma of left upper extremity 09/16/2015  . Acid reflux 09/04/2015  . CKD (chronic kidney disease) stage 3, GFR 30-59 ml/min (HCC) 09/04/2015  . Diabetic neuropathy ([place]) 09/04/2015  . Diverticulosis of colon 09/04/2015  . Dysthymic syndrome 09/04/2015  . Insomnia 09/04/2015  . Type 2 diabetes mellitus with diabetic neuropathy, without long-term current use of insulin ([place]) 09/04/2015  . Tobacco abuse 01/31/2012  . Atopic dermatitis 01/31/2012  . Intertrigo 09/09/2011  . OSA (obstructive sleep apnea) 08/26/2011  . COPD mixed type ([place])   . Hyperlipidemia   . Chronic restrictive lung disease 08/07/2011  . HTN (hypertension) 08/07/2011    Past Surgical History:  Procedure Laterality Date  . ABDOMINAL HYSTERECTOMY    . CHOLECYSTECTOMY    .  NASAL SINUS SURGERY    . TONSILLECTOMY       OB History   No obstetric history on file.     Family History  Problem Relation Age of Onset  . Heart attack Mother   . Hypertension Mother   . Lung cancer Father   . COPD Father   . Hypertension Father   . Heart attack Father     Social History   Tobacco Use  . Smoking status: Current Every Day Smoker    Packs/day: 1.50    Years: 46.00    Pack years: 69.00    Types: Cigarettes     Start date: 68  . Smokeless tobacco: Never Used  . Tobacco comment: 10/09 smoking 1.5 pack/daily  Vaping Use  . Vaping Use: Never used  Substance Use Topics  . Alcohol use: No  . Drug use: No    Home Medications Prior to Admission medications   Medication Sig Start Date End Date Taking? Authorizing Provider  hydrOXYzine (ATARAX/VISTARIL) 25 MG tablet Take 1 tablet (25 mg total) by mouth every 6 (six) hours. 12/24/20  Yes Deno Etienne, DO  predniSONE (DELTASONE) 20 MG tablet 2 tabs po daily x 4 days 12/24/20  Yes Deno Etienne, DO  albuterol (PROVENTIL) (2.5 MG/3ML) 0.083% nebulizer solution USE 1 VIAL IN NEBULIZER EVERY 6 HOURS AND AS NEEDED. Generic: VENTOLIN 11/08/19   Young, Tarri Fuller D, MD  albuterol (VENTOLIN HFA) 108 (90 Base) MCG/ACT inhaler Inhale 2 puffs into the lungs every 6 (six) hours as needed for wheezing or shortness of breath. wheezing 11/08/20   Baird Lyons D, MD  amLODipine (NORVASC) 5 MG tablet Take 5 mg by mouth daily.    [provider]  aspirin 81 MG tablet Take 81 mg by mouth daily.    [provider]  bismuth subsalicylate (PEPTO BISMOL) 262 MG/15ML suspension Take 30 mLs by mouth every 6 (six) hours as needed.    [provider]  budesonide-formoterol (SYMBICORT) 160-4.5 MCG/ACT inhaler Inhale 2 puffs into the lungs 2 (two) times daily. Rinse mouth 11/08/20   Baird Lyons D, MD  Cholecalciferol (VITAMIN D) 2000 units CAPS Take 1 capsule by mouth daily.    [provider]  DULoxetine (CYMBALTA) 60 MG capsule Take 60 mg by mouth every other day.     [provider]  glipiZIDE (GLUCOTROL) 5 MG tablet Take 5 mg by mouth daily before breakfast.     [provider]  glucose blood (ONETOUCH ULTRA) test strip CHECK SUGARS ONCE DAILY. DX: E11.40 06/28/19   [provider]  hydrochlorothiazide (HYDRODIURIL) 25 MG tablet Take 25 mg by mouth daily. 06/30/20   [provider]  HYDROcodone-acetaminophen  (NORCO/VICODIN) 5-325 MG tablet Take 1 tablet by mouth every 6 (six) hours as needed. 05/26/19   [provider]  LORazepam (ATIVAN) 0.5 MG tablet 1 twice daily if needed 11/08/19   Baird Lyons D, MD  Magnesium Oxide 500 MG TABS Take by mouth. 04/28/18   [provider]  metFORMIN (GLUCOPHAGE) 1000 MG tablet Take 1,000 mg by mouth 2 (two) times daily with a meal.    [provider]  methocarbamol (ROBAXIN) 500 MG tablet Take 500 mg by mouth daily as needed.  08/31/19   [provider]  metoprolol tartrate (LOPRESSOR) 50 MG tablet Take 1 tablet (50 mg total) by mouth once for 1 dose. Take 50mg  metoprolol tartrate (lopressor) 2 hr prior to CT scan 03/22/20 03/22/20  Larey Dresser, MD  olmesartan (  BENICAR) 40 MG tablet Take 40 mg by mouth daily. 03/20/17   [provider]  oxybutynin (DITROPAN-XL) 10 MG 24 hr tablet Take 10 mg by mouth 2 (two) times daily.    [provider]  polyethylene glycol (MIRALAX / GLYCOLAX) 17 g packet Take 17 g by mouth daily as needed.    [provider]  pregabalin (LYRICA) 100 MG capsule Take 100 mg by mouth 2 (two) times daily. 100mg  tablet in the morning. 200 mg in the evening 10/18/15   [provider]  rosuvastatin (CRESTOR) 40 MG tablet Take 1 tablet by mouth daily. 08/08/16   [provider]  spironolactone (ALDACTONE) 25 MG tablet Take 25 mg by mouth at bedtime. 12/22/19   [provider]  traZODone (DESYREL) 150 MG tablet Take by mouth at bedtime. Take 1/3 to 1 tablet at bedtime as needed.    [provider]    Allergies    Nicotine, Ppd [tuberculin purified protein derivative], Sulfa antibiotics, Sulfasalazine, and Penicillins  Review of Systems   Review of Systems  Constitutional: Negative for chills and fever.  HENT: Negative for congestion and rhinorrhea.   Eyes: Negative for redness and visual disturbance.  Respiratory: Negative for shortness of breath and  wheezing.   Cardiovascular: Negative for chest pain and palpitations.  Gastrointestinal: Negative for nausea and vomiting.  Genitourinary: Negative for dysuria and urgency.  Musculoskeletal: Negative for arthralgias and myalgias.  Skin: Positive for rash. Negative for pallor and wound.  Neurological: Negative for dizziness and headaches.    Physical Exam Updated Vital Signs BP (!) 131/107 (BP Location: Left Arm)   Pulse 85   Temp 98.4 F (36.9 C) (Oral)   Resp 20   Ht 5\' 8"  (1.727 m)   Wt 121.6 kg   SpO2 99%   BMI 40.75 kg/m   Physical Exam Vitals and nursing note reviewed.  Constitutional:      General: She is not in acute distress.    Appearance: She is well-developed. She is not diaphoretic.  HENT:     Head: Normocephalic and atraumatic.     Mouth/Throat:     Comments: No obvious pain or fluctuance of the floor of the mouth.  No significant posterior oropharyngeal erythema or edema.  Tolerating secretions out difficulty. Eyes:     Pupils: Pupils are equal, round, and reactive to light.  Cardiovascular:     Rate and Rhythm: Normal rate and regular rhythm.     Heart sounds: No murmur heard. No friction rub. No gallop.   Pulmonary:     Effort: Pulmonary effort is normal.     Breath sounds: No wheezing or rales.  Abdominal:     General: There is no distension.     Palpations: Abdomen is soft.     Tenderness: There is no abdominal tenderness.  Musculoskeletal:        General: No tenderness.     Cervical back: Normal range of motion and neck supple.  Skin:    General: Skin is warm and dry.     Findings: Rash present.     Comments: Diffuse papular rash mostly on the trunk.  Positive excoriations.  Neurological:     Mental Status: She is alert and oriented to person, place, and time.  Psychiatric:        Behavior: Behavior normal.     ED Results / Procedures / Treatments   Labs (all labs ordered are listed, but only abnormal results are displayed) Labs Reviewed  -  No data to display  EKG None  Radiology No results found.  Procedures Procedures   Medications Ordered in ED Medications  predniSONE (DELTASONE) tablet 60 mg (has no administration in time range)    ED Course  I have reviewed the triage vital signs and the nursing notes.  Pertinent labs & imaging results that were available during my care of the patient were reviewed by me and considered in my medical decision making (see chart for details).    MDM Rules/Calculators/A&P                          65 yo F with a chief complaint of an itchy rash.  No second system involved.  Clear lungs no abdominal pain blood pressures slightly hypertensive.  Will start on steroids.  Histamine blockers.  Have her hold her antibiotic.  3:15 AM:  I have discussed the diagnosis/risks/treatment options with the patient and believe the pt to be eligible for discharge home to follow-up with PCP, Dentist. We also discussed returning to the ED immediately if new or worsening sx occur. We discussed the sx which are most concerning (e.g., sudden worsening pain, fever, inability to tolerate by mouth) that necessitate immediate return. Medications administered to the patient during their visit and any new prescriptions provided to the patient are listed below.  Medications given during this visit Medications  predniSONE (DELTASONE) tablet 60 mg (has no administration in time range)     The patient appears reasonably screen and/or stabilized for discharge and I doubt any other medical condition or other [facility] requiring further screening, evaluation, or treatment in the ED at this time prior to discharge.   Final Clinical Impression(s) / ED Diagnoses Final diagnoses:  Allergic reaction, initial encounter    Rx / DC Orders ED Discharge Orders         Ordered    hydrOXYzine (ATARAX/VISTARIL) 25 MG tablet  Every 6 hours        12/24/20 0308    predniSONE (DELTASONE) 20 MG tablet        12/24/20 0308            Deno Etienne, DO 12/24/20 1025

## 2020-12-24 NOTE — ED Notes (Signed)
This nurse went to the lobby and checked on pt. No ABC concerns identified

## 2021-01-03 ENCOUNTER — Other Ambulatory Visit: Payer: Self-pay

## 2021-01-03 ENCOUNTER — Ambulatory Visit (INDEPENDENT_AMBULATORY_CARE_PROVIDER_SITE_OTHER): Payer: Medicare Other | Admitting: Acute Care

## 2021-01-03 ENCOUNTER — Ambulatory Visit
Admission: RE | Admit: 2021-01-03 | Discharge: 2021-01-03 | Disposition: A | Discharge status: Home / Self Care | Payer: Medicare Other | Source: Ambulatory Visit | Attending: Acute Care | Admitting: Acute Care

## 2021-01-03 ENCOUNTER — Encounter: Payer: Self-pay | Admitting: Acute Care

## 2021-01-03 VITALS — BP 160/66 | HR 108 | Temp 98.2°F | Ht 67.0 in | Wt 269.2 lb

## 2021-01-03 DIAGNOSIS — F17210 Nicotine dependence, cigarettes, uncomplicated: Secondary | ICD-10-CM

## 2021-01-03 DIAGNOSIS — Z87891 Personal history of nicotine dependence: Secondary | ICD-10-CM

## 2021-01-03 NOTE — Progress Notes (Signed)
Shared Decision Making Visit Lung Cancer Screening Program [phone])   Eligibility:  Age 65 y.o.  Pack Years Smoking History Calculation 69 pack year smoking history (# packs/per year x # years smoked)  Recent History of coughing up blood  no  Unexplained weight loss? no ( >Than 15 pounds within the last 6 months )  Prior History Lung / other cancer no (Diagnosis within the last 5 years already requiring surveillance chest CT Scans).  Smoking Status Current Smoker  Former Smokers: Years since quit: NA  Quit Date: NA  Visit Components:  Discussion included one or more decision making aids. yes  Discussion included risk/benefits of screening. yes  Discussion included potential follow up diagnostic testing for abnormal scans. yes  Discussion included meaning and risk of over diagnosis. yes  Discussion included meaning and risk of False Positives. yes  Discussion included meaning of total radiation exposure. yes  Counseling Included:  Importance of adherence to annual lung cancer LDCT screening. yes  Impact of comorbidities on ability to participate in the program. yes  Ability and willingness to under diagnostic treatment. yes  Smoking Cessation Counseling:  Current Smokers:   Discussed importance of smoking cessation. yes  Information about tobacco cessation classes and interventions provided to patient. yes  Patient provided with "ticket" for LDCT Scan. yes  Symptomatic Patient. no  Counseling NA  Diagnosis Code: Tobacco Use Z72.0  Asymptomatic Patient yes  Counseling (Intermediate counseling: > three minutes counseling) [id number]  Former Smokers:   Discussed the importance of maintaining cigarette abstinence. yes  Diagnosis Code: Personal History of Nicotine Dependence. R67.893  Information about tobacco cessation classes and interventions provided to patient. Yes  Patient provided with "ticket" for LDCT Scan. yes  Written Order for Lung Cancer  Screening with LDCT placed in Epic. Yes (CT Chest Lung Cancer Screening Low Dose W/O CM) [id number] Z12.2-Screening of respiratory organs Z87.891-Personal history of nicotine dependence  I have spent 25 minutes of face to face time with Ms. Schoneman discussing the risks and benefits of lung cancer screening. We viewed a power point together that explained in detail the above noted topics. We paused at intervals to allow for questions to be asked and answered to ensure understanding.We discussed that the single most powerful action that she can take to decrease her risk of developing lung cancer is to quit smoking. We discussed whether or not she is ready to commit to setting a quit date. We discussed options for tools to aid in quitting smoking including nicotine replacement therapy, non-nicotine medications, support groups, Quit Smart classes, and behavior modification. We discussed that often times setting smaller, more achievable goals, such as eliminating 1 cigarette a day for a week and then 2 cigarettes a day for a week can be helpful in slowly decreasing the number of cigarettes smoked. This allows for a sense of accomplishment as well as providing a clinical benefit. I gave her the " Be Stronger Than Your Excuses" card with contact information for community resources, classes, free nicotine replacement therapy, and access to mobile apps, text messaging, and on-line smoking cessation help. I have also given her my card and contact information in the event she needs to contact me. We discussed the time and location of the scan, and that either Doroteo Glassman RN or I will call with the results within 24-48 hours of receiving them. I have offered her  a copy of the power point we viewed  as a resource in the event they need reinforcement  of the concepts we discussed today in the office. The patient verbalized understanding of all of  the above and had no further questions upon leaving the office. They have my  contact information in the event they have any further questions.  I spent 3-4 minutes counseling on smoking cessation and the health risks of continued tobacco abuse.  I explained to the patient that there has been a high incidence of coronary artery disease noted on these exams. I explained that this is a non-gated exam therefore degree or severity cannot be determined. This patient is on statin therapy. I have asked the patient to follow-up with their PCP regarding any incidental finding of coronary artery disease and management with diet or medication as their PCP  feels is clinically indicated. The patient verbalized understanding of the above and had no further questions upon completion of the visit.      Magdalen Spatz, NP 01/03/2021

## 2021-01-03 NOTE — Patient Instructions (Signed)
Thank you for participating in the [facility]. It was our pleasure to meet you today. We will call you with the results of your scan within the next few days. Your scan will be assigned a Lung RADS category score by the physicians reading the scans.  This Lung RADS score determines follow up scanning.  See below for description of categories, and follow up screening recommendations. We will be in touch to schedule your follow up screening annually or based on recommendations of our providers. We will fax a copy of your scan results to your Primary Care Physician, or the physician who referred you to the program, to ensure they have the results. Please call the office if you have any questions or concerns regarding your scanning experience or results.  Our office number is [phone]. Please speak with Denise Phelps, RN. She is our Lung Cancer Screening RN. If she is unavailable when you call, please have the office staff send her a message. She will return your call at her earliest convenience. Remember, if your scan is normal, we will scan you annually as long as you continue to meet the criteria for the program. (Age 55-77, Current smoker or smoker who has quit within the last 15 years). If you are a smoker, remember, quitting is the single most powerful action that you can take to decrease your risk of lung cancer and other pulmonary, breathing related problems. We know quitting is hard, and we are here to help.  Please let us know if there is anything we can do to help you meet your goal of quitting. If you are a former smoker, congratulations. We are proud of you! Remain smoke free! Remember you can refer friends or family members through the number above.  We will screen them to make sure they meet criteria for the program. Thank you for helping us take better care of you by participating in Lung Screening.  Lung RADS Categories:  Lung RADS 1: no nodules  or definitely non-concerning nodules.  Recommendation is for a repeat annual scan in 12 months.  Lung RADS 2:  nodules that are non-concerning in appearance and behavior with a very low likelihood of becoming an active cancer. Recommendation is for a repeat annual scan in 12 months.  Lung RADS 3: nodules that are probably non-concerning , includes nodules with a low likelihood of becoming an active cancer.  Recommendation is for a 6-month repeat screening scan. Often noted after an upper respiratory illness. We will be in touch to make sure you have no questions, and to schedule your 6-month scan.  Lung RADS 4 A: nodules with concerning findings, recommendation is most often for a follow up scan in 3 months or additional testing based on our provider's assessment of the scan. We will be in touch to make sure you have no questions and to schedule the recommended 3 month follow up scan.  Lung RADS 4 B:  indicates findings that are concerning. We will be in touch with you to schedule additional diagnostic testing based on our provider's  assessment of the scan.   

## 2021-01-09 ENCOUNTER — Encounter: Payer: Self-pay | Admitting: *Deleted

## 2021-01-09 DIAGNOSIS — F17210 Nicotine dependence, cigarettes, uncomplicated: Secondary | ICD-10-CM

## 2021-01-09 DIAGNOSIS — Z87891 Personal history of nicotine dependence: Secondary | ICD-10-CM

## 2021-01-09 NOTE — Progress Notes (Signed)
Please call patient and let them  know their  low dose Ct was read as a Lung RADS 2: nodules that are benign in appearance and behavior with a very low likelihood of becoming a clinically active cancer due to size or lack of growth. Recommendation per radiology is for a repeat LDCT in 12 months. .Please let them  know we will order and schedule their  annual screening scan for 12/2021. Please let them  know there was notation of CAD on their  scan.  Please remind the patient  that this is a non-gated exam therefore degree or severity of disease  cannot be determined. Please have them  follow up with their PCP regarding potential risk factor modification, dietary therapy or pharmacologic therapy if clinically indicated. Pt.  is  currently on statin therapy. Please place order for annual  screening scan for  12/2021 and fax results to PCP. Thanks so much.  Langley Gauss, this patient does have CAD. She is on statin therapy.Have her follow up with PCP to determine any further treatment

## 2021-01-18 ENCOUNTER — Encounter: Payer: Self-pay | Admitting: Internal Medicine

## 2021-01-18 NOTE — Assessment & Plan Note (Signed)
Emphasis on smoking cessation, support.

## 2021-01-18 NOTE — Assessment & Plan Note (Signed)
Residual from exacerbation. Plan Screening CT low dose for nodules. Refill Symbicort and Ventolin

## 2021-01-18 NOTE — Assessment & Plan Note (Signed)
Benefits from CPAP with good compliance and control Plan- continue auto 10-20 

## 2021-03-19 ENCOUNTER — Telehealth: Payer: Self-pay | Admitting: Internal Medicine

## 2021-03-19 DIAGNOSIS — J984 Other disorders of lung: Secondary | ICD-10-CM

## 2021-03-19 DIAGNOSIS — J449 Chronic obstructive pulmonary disease, unspecified: Secondary | ICD-10-CM

## 2021-03-19 NOTE — Telephone Encounter (Signed)
Primary Pulmonologist: Young  Last office visit and with whom: 01/03/21 SG What do we see them for (pulmonary problems): Tobacco abuse  COPD mixed type (HCC)  OSA (obstructive sleep apnea)    Last OV assessment/plan:  Assessment & Plan Note by Deneise Lever, MD at 01/18/2021 3:01 PM  Author: Deneise Lever, MD Author Type: Physician Filed: 01/18/2021  3:02 PM  Note Status: Written Cosign: Cosign Not Required Encounter Date: 11/08/2020  Problem: Tobacco abuse  Editor: Deneise Lever, MD (Physician)             Emphasis on smoking cessation, support.        Assessment & Plan Note by Deneise Lever, MD at 01/18/2021 3:00 PM  Author: Deneise Lever, MD Author Type: Physician Filed: 01/18/2021  3:01 PM  Note Status: Written Cosign: Cosign Not Required Encounter Date: 11/08/2020  Problem: COPD mixed type [facility])  Editor: Deneise Lever, MD (Physician)             Residual from exacerbation. Plan Screening CT low dose for nodules. Refill Symbicort and Ventolin        Assessment & Plan Note by Deneise Lever, MD at 01/18/2021 2:59 PM  Author: Deneise Lever, MD Author Type: Physician Filed: 01/18/2021  2:59 PM  Note Status: Written Cosign: Cosign Not Required Encounter Date: 11/08/2020  Problem: OSA (obstructive sleep apnea)  Editor: Deneise Lever, MD (Physician)             Benefits from CPAP with good compliance and control Plan- continue auto 10-20        Patient Instructions by Deneise Lever, MD at 11/08/2020 10:30 AM  Author: Deneise Lever, MD Author Type: Physician Filed: 11/08/2020 11:07 AM  Note Status: Addendum Cosign: Cosign Not Required Encounter Date: 11/08/2020  Editor: Deneise Lever, MD (Physician)      Prior Versions: 1. Deneise Lever, MD (Physician) at 11/08/2020 11:04 AM - Signed  Order- referral to start low dose CT chest screening dx COPD mixed type, Tobacco user   Refills sent for Symbicort and Ventolin rescue inhaler   Please call  if we can help   Handicapped Parking        Reason for call:  Pt states having increased SOB x 4 days. Pt denise N/V/D/F/C pt states she does not have access to a home covid test nor has she had a test completed recently. Pt states using Albuterol HFA but not feeling that it helps. Pt states using Symbicort "when she remembers to". Pt is also requesting new nebulizer machine and tubing. DME is [facility]. Pt states she uses [facility] on [address]. Dr. Annamaria Boots please advise.   (examples of things to ask: : When did symptoms start? Fever? Cough? Productive? Color to sputum? More sputum than usual? Wheezing? Have you needed increased oxygen? Are you taking your respiratory medications? What over the counter measures have you tried?)  Allergies  Allergen Reactions   Nicotine Other (See Comments)    PATCH- gives blisters and rash   Ppd [Tuberculin Purified Protein Derivative]     Pt states she has hives and itching with this test   Sulfa Antibiotics Other (See Comments)    Dizzines, nausea   Sulfasalazine Other (See Comments)    Dizzines, nausea   Penicillins Rash    Immunization History  Administered Date(s) Administered   Fluad Quad(high Dose 65+) 05/07/2019   Influenza Inj Mdck Quad Pf 05/01/2017, 05/28/2018   Influenza  Nasal 04/28/2010   Influenza Split 03/29/2012, 04/28/2013, 05/10/2015   Influenza, High Dose Seasonal PF 06/26/2020   Influenza,inj,Quad PF,6+ Mos 04/12/2015, 05/01/2016   Influenza-Unspecified 05/19/2014, 05/16/2017   PFIZER Comirnaty(Gray Top)Covid-19 Tri-Sucrose Vaccine 01/01/2021   PFIZER(Purple Top)SARS-COV-2 Vaccination 09/06/2019, 10/01/2019, 07/11/2020   Pneumococcal Conjugate-13 05/24/2019   Pneumococcal Polysaccharide-23 06/21/2002   Td 09/20/2002   Tdap 11/01/2010

## 2021-03-19 NOTE — Telephone Encounter (Signed)
Suggest we order prednisone 10 mg, # 20, 4 X 2 DAYS, 3 X 2 DAYS, 2 X 2 DAYS, 1 X 2 DAYS   Ok to order new compressor nebulizer machine and to refill her neb medicine if she needs it. I don't know if [facility] does that anymore.

## 2021-03-22 NOTE — Telephone Encounter (Signed)
ATC LVMTCB x 1  

## 2021-03-22 NOTE — Telephone Encounter (Signed)
I called brother listed on DPR, Chrissie Noa  I advised we need to reach pt to let her know of Dr Janee Morn advice  He sent her a text to have her call us  He confirmed that the number we have for her is correct  Will await her call back

## 2021-03-23 MED ORDER — PREDNISONE 10 MG PO TABS: ORAL_TABLET | ORAL | 0 refills | Status: DC

## 2021-03-23 NOTE — Telephone Encounter (Signed)
I called and spoke with patient regarding CY recs. Patient verbalized understanding. I have sent in pred taper to preferred pharmacy and sent an order to [facility] for new machine. Patient stated she plenty of neb med. Patient will call if symptoms worsen/persist. Nothing further needed.

## 2021-03-23 NOTE — Telephone Encounter (Signed)
Patient is returning phone call. Patient phone number is [phone].

## 2021-05-09 NOTE — Progress Notes (Signed)
HPI  female smoker followed for OSA, COPD with history pneumonia, tobacco abuse, Chronic restrictive lung disease, lung nodules, Complicated by HBP, Atopic dermatitis,  NPSG 01/04/03 [place] Severe OSA, RDI.( AHI )32.3/hr. [place] 11/24, weight 270 lbs. PFT 08/26/2011-restriction. FVC 68%, FEV1 62%, FEV1/FVC 0.93. Office Spirometry 02/24/2014-mild obstruction and restriction of exhaled volume. FVC 2.64/72%, FEV1 1.80/63%, FEV1/FVC 0.68, FEF 25-75% 0.92/35% Office Spirometry 10/19/2015-mild obstruction with mild restriction of exhaled volume. FVC 2.62/73%, FEV1 1.92/69%, ratio 0.73, FEF 25-75 percent 1.35/54% CT chest 01/15/17, 04/11/17 Anterior right upper lobe nodule, multiple bilateral small upper lobe nodules Office Spirometry 07/13/18-mild obstruction, mild restriction of FVC.  FVC 2.4/63%, FEV1 1.7/60%, ratio 1.73, FEF 25-75% 1.3/52% ------------------------------------------------------------------------------------------   11/08/20- 51year-old female Smoker(69 pk yrs) followed for OSA, COPD with history pneumonia, tobacco abuse, Chronic restrictive lung disease, lung nodules, Complicated by HBP, Atopic dermatitis, DM2,  -neb albuterol, Ventolin hfa, Symbicort 160,  CPAP auto 10-20 O2 2 L sleep/[facility] Download-compliance 97%, AHI 0.6./hr Body weight-265 lbs Covid vax-3 Phizer Flu vax-had -----Patient states she had COPD exacerbation and is still taking prednisone and has finished zpak. States her breathing is still "rough" dry cough. Needs refill on Symbicort. Patient is not sleeping good states its because her husband passed in September. Machine is working good. Patient asking for CT. Reviewed Download. Comfortable with her CPAP.  Had sinus infection w COPD exacerbation treated at [facility]. Residual congested cough and sore throat, no longer productive. Had CXR at [facility] w no special comment reported to her.  She would like to start low dose CT screening.  05/10/21- 62year-old female Smoker(69 pk  yrs) followed for OSA, COPD with history pneumonia, tobacco abuse, Chronic restrictive lung disease, lung nodules, Complicated by HBP, Atopic dermatitis, DM2, CAD, Aortic Atherosclerosis,  - neb albuterol, Ventolin hfa, Symbicort 160,  CPAP auto 10-20 [facility] Download-compliance 93%, AHI 0.9/ hr Body weight today-284 lbs Covid vax-4 Phizer Flu vax-had -----Pt states trouble breathing Treated w prednisone, zith for COPD exacerbation 9/13 by PCP Dr Jeralene Huff. Download reviewed.  She is comfortable with CPAP. She reports 3 flares of respiratory discomfort treated with prednisone-not lasting. Developed rash on clindamycin-noted. Not trying to stop smoking. CT chest low dose screen 01/04/21-  IMPRESSION: 1. Lung-RADS 2, benign appearance or behavior. Continue annual screening with low-dose chest CT without contrast in 12 months. 2. Three-vessel coronary atherosclerosis. 3. Aortic Atherosclerosis (ICD10-I70.0) and Emphysema (ICD10-J43.9).  ROS-see HPI  + = positive Constitutional:   No-   weight loss, night sweats, fevers, chills, fatigue, lassitude. HEENT:   No-  headaches, difficulty swallowing, tooth/dental problems, +sore throat,       No-  sneezing, itching, ear ache, nasal congestion, post nasal drip,  CV:  No-   chest pain, orthopnea, PND, swelling in lower extremities, anasarca, dizziness, palpitations Resp: +  shortness of breath with exertion or at rest.               productive cough,  +non-productive cough,  No- coughing up of blood.              No-   change in color of mucus.  No- wheezing.   Skin: No-   rash or lesions. GI:  No-   heartburn, indigestion, abdominal pain, nausea, vomiting, GU: MS:  + joint pain or swelling.  Neuro-     + chronic tremor Psych:  No- change in mood or affect. + depression or anxiety.  No memory loss.  OBJ- Physical Exam   + odor tobacco General-  Alert, Oriented, Affect + anxious, Distress- none acute, +obese,  Skin- rash-none, lesions- none,  excoriation- none.  Lymphadenopathy- none Head- atraumatic            Eyes- Gross vision intact, PERRLA, conjunctivae and secretions clear, strabismus            Ears- Hearing, canals-normal            Nose- Clear, no-Septal dev, mucus, polyps, erosion, perforation             Throat- Mallampati II-III , mucosa clear , drainage- none, tonsils- atrophic.+ Mild hoarseness Neck- flexible , trachea midline, no stridor , thyroid nl, carotid no bruit Chest - symmetrical excursion , unlabored           Heart/CV- RRR , no murmur , no gallop  , no rub, nl s1 s2                           - JVD- none , edema R>L trace, stasis changes- none, varices- none           Lung- wheeze+, cough+light, dullness-none, rub- none           Chest wall-  Abd-  Br/ Gen/ Rectal- Not done, not indicated Extrem- cyanosis- none, clubbing, none, atrophy- none, strength- nl Neuro- +resting tremor/ head bob persistent

## 2021-05-10 ENCOUNTER — Ambulatory Visit (INDEPENDENT_AMBULATORY_CARE_PROVIDER_SITE_OTHER): Payer: Medicare Other | Admitting: Internal Medicine

## 2021-05-10 ENCOUNTER — Ambulatory Visit (INDEPENDENT_AMBULATORY_CARE_PROVIDER_SITE_OTHER): Payer: Medicare Other

## 2021-05-10 ENCOUNTER — Encounter: Payer: Self-pay | Admitting: Internal Medicine

## 2021-05-10 ENCOUNTER — Other Ambulatory Visit: Payer: Self-pay

## 2021-05-10 VITALS — BP 140/76 | HR 78 | Temp 98.8°F | Ht 67.0 in | Wt 284.6 lb

## 2021-05-10 DIAGNOSIS — J449 Chronic obstructive pulmonary disease, unspecified: Secondary | ICD-10-CM | POA: Diagnosis not present

## 2021-05-10 DIAGNOSIS — F17210 Nicotine dependence, cigarettes, uncomplicated: Secondary | ICD-10-CM

## 2021-05-10 DIAGNOSIS — J441 Chronic obstructive pulmonary disease with (acute) exacerbation: Secondary | ICD-10-CM

## 2021-05-10 DIAGNOSIS — G4733 Obstructive sleep apnea (adult) (pediatric): Secondary | ICD-10-CM | POA: Diagnosis not present

## 2021-05-10 MED ORDER — YUPELRI 175 MCG/3ML IN SOLN: 175.0000 ug | Freq: Every day | RESPIRATORY_TRACT | 0 refills | Status: DC

## 2021-05-10 MED ORDER — METHYLPREDNISOLONE ACETATE 80 MG/ML IJ SUSP
80.0000 mg | Freq: Once | INTRAMUSCULAR | Status: AC
  Administered 2021-05-10: 80 mg via INTRAMUSCULAR

## 2021-05-10 MED ORDER — BREZTRI AEROSPHERE 160-9-4.8 MCG/ACT IN AERO: 2.0000 | INHALATION_SPRAY | Freq: Two times a day (BID) | RESPIRATORY_TRACT | 0 refills | Status: DC

## 2021-05-10 NOTE — Patient Instructions (Signed)
Order- CXR   dx COPD exacerbation  Order- depo 80   dx COPD exacerbation  Order- samples Yupeleri neb solution    1 neb daily  Order sample x 2 Breztri inhaler   inhale 2 puffs then rinse your mouth, twice daily Try this instead of Symbicort  Use your nebulizer with albuterol 2-3 times daily  Order- lab- CBC w diff, IgE  Dx COPD exacerbation  Order- send name to Pharmacy Smoking Cessation program to help stop smoking

## 2021-05-11 LAB — CBC WITH DIFFERENTIAL/PLATELET
Basophils Absolute: 0.1 10*3/uL (ref 0.0–0.1)
Basophils Relative: 0.8 % (ref 0.0–3.0)
Eosinophils Absolute: 0.4 10*3/uL (ref 0.0–0.7)
Eosinophils Relative: 3.5 % (ref 0.0–5.0)
HCT: 36.9 % (ref 36.0–46.0)
Hemoglobin: 12.2 g/dL (ref 12.0–15.0)
Lymphocytes Relative: 14.7 % (ref 12.0–46.0)
Lymphs Abs: 1.5 10*3/uL (ref 0.7–4.0)
MCHC: 33.2 g/dL (ref 30.0–36.0)
MCV: 85.6 fl (ref 78.0–100.0)
Monocytes Absolute: 0.5 10*3/uL (ref 0.1–1.0)
Monocytes Relative: 4.7 % (ref 3.0–12.0)
Neutro Abs: 7.7 10*3/uL (ref 1.4–7.7)
Neutrophils Relative %: 76.3 % (ref 43.0–77.0)
Platelets: 195 10*3/uL (ref 150.0–400.0)
RBC: 4.31 Mil/uL (ref 3.87–5.11)
RDW: 16.9 % — ABNORMAL HIGH (ref 11.5–15.5)
WBC: 10.2 10*3/uL (ref 4.0–10.5)

## 2021-05-11 LAB — IGE: IgE (Immunoglobulin E), Serum: 903 kU/L — ABNORMAL HIGH (ref <=114)

## 2021-05-11 NOTE — Progress Notes (Signed)
Spoke with pt and she voices understanding. Pt did not have any further questions.

## 2021-05-14 ENCOUNTER — Telehealth: Payer: Self-pay | Admitting: Pharmacist

## 2021-05-14 NOTE — Telephone Encounter (Signed)
Received notification that referral for smoking cessation program was placed on 05/10/21. Routing to [facility] pulm tobacco pool for f/u  Knox Saliva, PharmD, MPH, BCPS Clinical Pharmacist (Rheumatology and Pulmonology)

## 2021-05-21 ENCOUNTER — Telehealth: Payer: Self-pay

## 2021-06-04 NOTE — Telephone Encounter (Signed)
Smoking cessation referral pending. Patient was eating at a restaurant and unable to speak when I called her. Will call again this week.   Joseph Art, Pharm.D. PGY-1 Pharmacy Resident 06/04/2021 9:44 AM

## 2021-06-18 ENCOUNTER — Encounter (INDEPENDENT_AMBULATORY_CARE_PROVIDER_SITE_OTHER): Payer: Self-pay

## 2021-07-25 NOTE — Progress Notes (Signed)
HPI  female smoker followed for OSA, COPD with history pneumonia, tobacco abuse, Chronic restrictive lung disease, lung nodules, Complicated by HBP, Atopic dermatitis,  NPSG 01/04/03 [place] Severe OSA, RDI.( AHI )32.3/hr. [place] 11/24, weight 270 lbs. PFT 08/26/2011-restriction. FVC 68%, FEV1 62%, FEV1/FVC 0.93. Office Spirometry 02/24/2014-mild obstruction and restriction of exhaled volume. FVC 2.64/72%, FEV1 1.80/63%, FEV1/FVC 0.68, FEF 25-75% 0.92/35% Office Spirometry 10/19/2015-mild obstruction with mild restriction of exhaled volume. FVC 2.62/73%, FEV1 1.92/69%, ratio 0.73, FEF 25-75 percent 1.35/54% CT chest 01/15/17, 04/11/17 Anterior right upper lobe nodule, multiple bilateral small upper lobe nodules Office Spirometry 07/13/18-mild obstruction, mild restriction of FVC.  FVC 2.4/63%, FEV1 1.7/60%, ratio 1.73, FEF 25-75% 1.3/52% ------------------------------------------------------------------------------------------    05/10/21- 27year-old female Smoker(69 pk yrs) followed for OSA, COPD with history pneumonia, tobacco abuse, Chronic restrictive lung disease, lung nodules, Complicated by HBP, Atopic dermatitis, DM2, CAD, Aortic Atherosclerosis,  - neb albuterol, Ventolin hfa, Symbicort 160,  CPAP auto 10-20 [employer] Download-compliance 93%, AHI 0.9/ hr Body weight today-284 lbs Covid vax- Flu vax- -----Pt states trouble breathing Treated w prednisone, zith for COPD exacerbation 9/13 by PCP Dr Jeralene Huff. CT chest low dose screen 01/04/21-  IMPRESSION: 1. Lung-RADS 2, benign appearance or behavior. Continue annual screening with low-dose chest CT without contrast in 12 months. 2. Three-vessel coronary atherosclerosis. 3. Aortic Atherosclerosis (ICD10-I70.0) and Emphysema (ICD10-J43.9).  07/26/21- 33year-old female Smoker(70 pk yrs) followed for OSA, COPD with history pneumonia, tobacco abuse, Chronic restrictive lung disease, lung nodules, Complicated by HBP, Atopic dermatitis, DM2, CAD,  Aortic Atherosclerosis,  - neb albuterol, Ventolin hfa,  CPAP auto 10-20 [employer] Download-compliance - compliance 100%, AHI 0.5/ hr Body weight today- Covid vax-5 Phizer Flu vax-had -----Patient having shortness of breath with exertion, feels like breathing is better since last visit and with Breztri but she ran out. Would like RX sent in. Has not tried the [place] nebulizer solution.  Not interested in smoking cessation help. Download reviewed.  No problems with CPAP.  ROS-see HPI  + = positive Constitutional:   No-   weight loss, night sweats, fevers, chills, fatigue, lassitude. HEENT:   No-  headaches, difficulty swallowing, tooth/dental problems, +sore throat,       No-  sneezing, itching, ear ache, nasal congestion, post nasal drip,  CV:  No-   chest pain, orthopnea, PND, swelling in lower extremities, anasarca, dizziness, palpitations Resp: +  shortness of breath with exertion or at rest.               productive cough,  +non-productive cough,  No- coughing up of blood.              No-   change in color of mucus.  No- wheezing.   Skin: No-   rash or lesions. GI:  No-   heartburn, indigestion, abdominal pain, nausea, vomiting, GU: MS:  + joint pain or swelling.  Neuro-     + chronic tremor Psych:  No- change in mood or affect. + depression or anxiety.  No memory loss.  OBJ- Physical Exam   + odor tobacco General- Alert, Oriented, Affect + anxious, Distress- none acute, +obese,  Skin- rash-none, lesions- none, excoriation- none.  Lymphadenopathy- none Head- atraumatic            Eyes- Gross vision intact, PERRLA, conjunctivae and secretions clear, strabismus            Ears- Hearing, canals-normal            Nose- Clear, no-Septal dev, mucus,  polyps, erosion, perforation             Throat- Mallampati II-III , mucosa clear , drainage- none, tonsils- atrophic.+ Mild hoarseness Neck- flexible , trachea midline, no stridor , thyroid nl, carotid no bruit Chest - symmetrical  excursion , unlabored           Heart/CV- RRR , no murmur , no gallop  , no rub, nl s1 s2                           - JVD- none , edema R>L trace, stasis changes- none, varices- none           Lung- + diminished but clear, wheeze- none, cough+light, dullness-none, rub- none           Chest wall-  Abd-  Br/ Gen/ Rectal- Not done, not indicated Extrem- cyanosis- none, clubbing, none, atrophy- none, strength- nl Neuro- +resting tremor/ head bob persistent

## 2021-07-26 ENCOUNTER — Encounter: Payer: Self-pay | Admitting: Internal Medicine

## 2021-07-26 ENCOUNTER — Other Ambulatory Visit: Payer: Self-pay

## 2021-07-26 ENCOUNTER — Ambulatory Visit (INDEPENDENT_AMBULATORY_CARE_PROVIDER_SITE_OTHER): Payer: Medicare Other | Admitting: Internal Medicine

## 2021-07-26 DIAGNOSIS — R918 Other nonspecific abnormal finding of lung field: Secondary | ICD-10-CM

## 2021-07-26 DIAGNOSIS — Z720 Tobacco use: Secondary | ICD-10-CM

## 2021-07-26 DIAGNOSIS — G4733 Obstructive sleep apnea (adult) (pediatric): Secondary | ICD-10-CM

## 2021-07-26 MED ORDER — BREZTRI AEROSPHERE 160-9-4.8 MCG/ACT IN AERO: INHALATION_SPRAY | RESPIRATORY_TRACT | 12 refills | Status: DC

## 2021-07-26 NOTE — Patient Instructions (Signed)
Brezttri refilled  You will be contacted before next screening CT scan in June  Please call if we can help  I hope you will try to stop smoking

## 2021-07-26 NOTE — Assessment & Plan Note (Signed)
Benefits from CPAP with good compliance and control Plan- continue auto 10-20 

## 2021-07-26 NOTE — Assessment & Plan Note (Signed)
Cessation help was offered again and declined.

## 2021-07-26 NOTE — Assessment & Plan Note (Signed)
She continues low-dose screening chest CT with next imaging due in June, 2023.

## 2021-08-21 ENCOUNTER — Encounter: Payer: Self-pay | Admitting: Internal Medicine

## 2021-08-21 NOTE — Assessment & Plan Note (Addendum)
Repeated exacerbations reflecting ongoing smoking. Plan-smoking cessation emphasized-work with pharmacy team.  CXR, try Breztri,  Depo 80, Yupelri nebulizer solution, labs for CBC with differential and IgE

## 2021-08-21 NOTE — Assessment & Plan Note (Signed)
Benefits from CPAP with good compliance and control Plan- continue auto 10-20 

## 2021-08-27 ENCOUNTER — Other Ambulatory Visit (HOSPITAL_BASED_OUTPATIENT_CLINIC_OR_DEPARTMENT_OTHER): Payer: Self-pay

## 2021-08-27 MED ORDER — OZEMPIC (0.25 OR 0.5 MG/DOSE) 2 MG/1.5ML ~~LOC~~ SOPN
PEN_INJECTOR | SUBCUTANEOUS | 1 refills | Status: DC
  Filled 2021-08-27: qty 1.5, 28d supply, fill #0

## 2021-08-29 ENCOUNTER — Other Ambulatory Visit (HOSPITAL_BASED_OUTPATIENT_CLINIC_OR_DEPARTMENT_OTHER): Payer: Self-pay

## 2021-10-01 NOTE — Telephone Encounter (Signed)
Documented in error ? ?Joseph Art, Pharm.D. ?PGY-1 Pharmacy Resident ? ?

## 2022-01-03 ENCOUNTER — Other Ambulatory Visit: Payer: Medicare Other

## 2022-01-21 ENCOUNTER — Encounter: Payer: Self-pay | Admitting: Internal Medicine

## 2022-01-22 ENCOUNTER — Encounter: Payer: Self-pay | Admitting: Internal Medicine

## 2022-01-22 ENCOUNTER — Ambulatory Visit (INDEPENDENT_AMBULATORY_CARE_PROVIDER_SITE_OTHER): Payer: Medicare Other | Admitting: Internal Medicine

## 2022-01-22 VITALS — BP 140/70 | HR 69 | Temp 97.9°F | Ht 67.0 in | Wt 270.8 lb

## 2022-01-22 DIAGNOSIS — G4733 Obstructive sleep apnea (adult) (pediatric): Secondary | ICD-10-CM

## 2022-01-22 DIAGNOSIS — F17210 Nicotine dependence, cigarettes, uncomplicated: Secondary | ICD-10-CM | POA: Diagnosis not present

## 2022-01-22 DIAGNOSIS — J449 Chronic obstructive pulmonary disease, unspecified: Secondary | ICD-10-CM | POA: Diagnosis not present

## 2022-01-22 NOTE — Progress Notes (Signed)
HPI  female smoker followed for OSA, COPD with history pneumonia, tobacco abuse, Chronic restrictive lung disease, lung nodules, Complicated by HBP, Atopic dermatitis,  NPSG 01/04/03 [place] Severe OSA, RDI.( AHI )32.3/hr. [place] 11/24, weight 270 lbs. PFT 08/26/2011-restriction. FVC 68%, FEV1 62%, FEV1/FVC 0.93. Office Spirometry 02/24/2014-mild obstruction and restriction of exhaled volume. FVC 2.64/72%, FEV1 1.80/63%, FEV1/FVC 0.68, FEF 25-75% 0.92/35% Office Spirometry 10/19/2015-mild obstruction with mild restriction of exhaled volume. FVC 2.62/73%, FEV1 1.92/69%, ratio 0.73, FEF 25-75 percent 1.35/54% CT chest 01/15/17, 04/11/17 Anterior right upper lobe nodule, multiple bilateral small upper lobe nodules Office Spirometry 07/13/18-mild obstruction, mild restriction of FVC.  FVC 2.4/63%, FEV1 1.7/60%, ratio 1.73, FEF 25-75% 1.3/52% Walk test O2 qualifying 01/22/2022-lowest saturation 98% on room air with maximum heart rate 100.  Does not qualify. -----------------------------------------------------------------------------------------  07/26/21- 84year-old female Smoker(70 pk yrs) followed for OSA, COPD with history pneumonia, tobacco abuse, Chronic restrictive lung disease, lung nodules, Complicated by HBP, Atopic dermatitis, DM2, CAD, Aortic Atherosclerosis,  - neb albuterol, Ventolin hfa,  CPAP auto 10-20 [employer] Download-compliance - compliance 100%, AHI 0.5/ hr Body weight today- Covid vax-5 Phizer Flu vax-had -----Patient having shortness of breath with exertion, feels like breathing is better since last visit and with Breztri but she ran out. Would like RX sent in. Has not tried the [place] nebulizer solution.  Not interested in smoking cessation help. Download reviewed.  No problems with CPAP.  01/22/22- 66year-old female Smoker(70 pk yrs) followed for OSA, COPD with history pneumonia, tobacco abuse, Chronic restrictive lung disease, lung nodules, Complicated by HBP, Atopic  dermatitis, DM2, CAD, Aortic Atherosclerosis,  - neb albuterol, Ventolin hfa, Neb Yupelri (not tried), Neb albuterol, Breztri, Trazodone 150, CPAP auto 10-20 [employer] Download-compliance - compliance 100%, AHI 0.5/ hr Body weight today- Covid vax-5 Phizer Flu vax-had Download reviewed.  She complains that her CPAP machine will not cut off automatically.  It seems to be old enough for replacement. Continues to smoke against counseling.  Admits gradually easier dyspnea on exertion with no acute events.  She does not feel acutely ill.  Rare chest pains not progressive and not clearly exertional. Walk test O2 qualifying 01/22/2022-lowest saturation 98% on room air with maximum heart rate 100.  Does not qualify.                       //CXR next visit?//  ROS-see HPI  + = positive Constitutional:   No-   weight loss, night sweats, fevers, chills, fatigue, lassitude. HEENT:   No-  headaches, difficulty swallowing, tooth/dental problems, +sore throat,       No-  sneezing, itching, ear ache, nasal congestion, post nasal drip,  CV:  No-   chest pain, orthopnea, PND, swelling in lower extremities, anasarca, dizziness, palpitations Resp: +  shortness of breath with exertion or at rest.               productive cough,  +non-productive cough,  No- coughing up of blood.              No-   change in color of mucus.  No- wheezing.   Skin: No-   rash or lesions. GI:  No-   heartburn, indigestion, abdominal pain, nausea, vomiting, GU: MS:  + joint pain or swelling.  Neuro-     + chronic tremor Psych:  No- change in mood or affect. + depression or anxiety.  No memory loss.  OBJ- Physical Exam   + odor tobacco General- Alert,  Oriented, Affect + anxious, Distress- none acute, +obese,  Skin- rash-none, lesions- none, excoriation- none.  Lymphadenopathy- none Head- atraumatic            Eyes- Gross vision intact, PERRLA, conjunctivae and secretions clear, strabismus            Ears- Hearing, canals-normal             Nose- Clear, no-Septal dev, mucus, polyps, erosion, perforation             Throat- Mallampati II-III , mucosa clear , drainage- none, tonsils- atrophic.+ Mild hoarseness Neck- flexible , trachea midline, no stridor , thyroid nl, carotid no bruit Chest - symmetrical excursion , unlabored           Heart/CV- RRR , no murmur , no gallop  , no rub, nl s1 s2                           - JVD- none , edema R>L trace, stasis changes- none, varices- none           Lung- + diminished but clear, wheeze- none, cough+light, dullness-none, rub- none           Chest wall-  Abd-  Br/ Gen/ Rectal- Not done, not indicated Extrem- cyanosis- none, clubbing, none, atrophy- none, strength- nl Neuro- +resting tremor/ head bob persistent

## 2022-02-28 ENCOUNTER — Encounter: Payer: Self-pay | Admitting: Internal Medicine

## 2022-02-28 NOTE — Assessment & Plan Note (Signed)
She declined support for smoking cessation at this time

## 2022-02-28 NOTE — Assessment & Plan Note (Signed)
Unfortunately she continues to smoke.  Walk test did not qualify for home oxygen today. Plan-schedule PFT

## 2022-02-28 NOTE — Assessment & Plan Note (Signed)
Benefits from CPAP with good compliance and control Plan-continue auto 10-20.  Replace old machine

## 2022-04-26 ENCOUNTER — Ambulatory Visit (INDEPENDENT_AMBULATORY_CARE_PROVIDER_SITE_OTHER): Payer: Medicare Other | Admitting: Internal Medicine

## 2022-04-26 DIAGNOSIS — J449 Chronic obstructive pulmonary disease, unspecified: Secondary | ICD-10-CM

## 2022-04-26 LAB — PULMONARY FUNCTION TEST
DL/VA % pred: 117 %
DL/VA: 4.78 ml/min/mmHg/L
DLCO cor % pred: 96 %
DLCO cor: 20.96 ml/min/mmHg
DLCO unc % pred: 96 %
DLCO unc: 20.96 ml/min/mmHg
FEF 25-75 Post: 1.27 L/sec
FEF 25-75 Pre: 1.09 L/sec
FEF2575-%Change-Post: 16 %
FEF2575-%Pred-Post: 58 %
FEF2575-%Pred-Pre: 50 %
FEV1-%Change-Post: 2 %
FEV1-%Pred-Post: 66 %
FEV1-%Pred-Pre: 65 %
FEV1-Post: 1.74 L
FEV1-Pre: 1.71 L
FEV1FVC-%Change-Post: 1 %
FEV1FVC-%Pred-Pre: 92 %
FEV6-%Change-Post: 3 %
FEV6-%Pred-Post: 73 %
FEV6-%Pred-Pre: 71 %
FEV6-Post: 2.44 L
FEV6-Pre: 2.36 L
FEV6FVC-%Change-Post: 2 %
FEV6FVC-%Pred-Post: 104 %
FEV6FVC-%Pred-Pre: 102 %
FVC-%Change-Post: 1 %
FVC-%Pred-Post: 70 %
FVC-%Pred-Pre: 70 %
FVC-Post: 2.44 L
FVC-Pre: 2.41 L
Post FEV1/FVC ratio: 71 %
Post FEV6/FVC ratio: 100 %
Pre FEV1/FVC ratio: 71 %
Pre FEV6/FVC Ratio: 98 %
RV % pred: 108 %
RV: 2.49 L
TLC % pred: 92 %
TLC: 5.07 L

## 2022-04-26 NOTE — Patient Instructions (Signed)
Full PFT performed today. °

## 2022-04-26 NOTE — Progress Notes (Signed)
Full PFT performed today. °

## 2022-06-15 ENCOUNTER — Encounter: Payer: Self-pay | Admitting: Internal Medicine

## 2022-06-17 NOTE — Telephone Encounter (Signed)
RSV causes a flu-like illness that can be pretty hard on older folks and people with lung trouble. I think it would be a good idea. You can get it at most drug stores.

## 2022-07-24 NOTE — Progress Notes (Signed)
HPI  female smoker followed for OSA, COPD with history pneumonia, tobacco abuse, Chronic restrictive lung disease, lung nodules, Complicated by HBP, Atopic dermatitis,  NPSG 01/04/03 [place] Severe OSA, RDI.( AHI )32.3/hr. [place] 11/24, weight 270 lbs. PFT 08/26/2011-restriction. FVC 68%, FEV1 62%, FEV1/FVC 0.93. Office Spirometry 02/24/2014-mild obstruction and restriction of exhaled volume. FVC 2.64/72%, FEV1 1.80/63%, FEV1/FVC 0.68, FEF 25-75% 0.92/35% Office Spirometry 10/19/2015-mild obstruction with mild restriction of exhaled volume. FVC 2.62/73%, FEV1 1.92/69%, ratio 0.73, FEF 25-75 percent 1.35/54% CT chest 01/15/17, 04/11/17 Anterior right upper lobe nodule, multiple bilateral small upper lobe nodules Office Spirometry 07/13/18-mild obstruction, mild restriction of FVC.  FVC 2.4/63%, FEV1 1.7/60%, ratio 1.73, FEF 25-75% 1.3/52% Walk test O2 qualifying 01/22/2022-lowest saturation 98% on room air with maximum heart rate 100.  Does not qualify. PFT 04/26/22- mod obst, no resp. Nl Volume and DLCO -----------------------------------------------------------------------------------------   01/22/22- 66year-old female Smoker(70 pk yrs) followed for OSA, COPD with history pneumonia, tobacco abuse, Chronic restrictive lung disease, lung nodules, Complicated by HBP, Atopic dermatitis, DM2, CAD, Aortic Atherosclerosis,  - neb albuterol, Ventolin hfa, Neb Yupelri (not tried), Neb albuterol, Breztri, Trazodone 150, CPAP auto 10-20 [employer] Download-compliance - compliance 100%, AHI 0.5/ hr Body weight today- Covid vax-5 Phizer Flu vax-had Download reviewed.  She complains that her CPAP machine will not cut off automatically.  It seems to be old enough for replacement. Continues to smoke against counseling.  Admits gradually easier dyspnea on exertion with no acute events.  She does not feel acutely ill.  Rare chest pains not progressive and not clearly exertional. Walk test O2 qualifying  01/22/2022-lowest saturation 98% on room air with maximum heart rate 100.  Does not qualify.                       //CXR next visit?//  07/25/22- 66 year-old female Smoker(Still 1.5 ppd/70 pk yrs) followed for OSA, COPD with history pneumonia, tobacco abuse, Chronic restrictive lung disease, lung nodules, Complicated by HBP, Atopic dermatitis, DM2, CAD, Aortic Atherosclerosis,  - neb albuterol, Ventolin hfa, Neb Yupelri (not tried), Neb albuterol, Breztri, Trazodone 150, CPAP auto 10-20 [employer] Download-compliance -100%, AHI 0.7/ hr Body weight today-277 lbs Covid vax-6 Phizer Flu vax-had PFT 04/26/22- mod obst, no resp. Nl Volume and DLCO -----Pt states she is doing pretty good  Still smoking 1.5 ppd- discussed Trying to care for mother w/ Alzheimer's. Download reviewed. Doing fine with CPAP.  ROS-see HPI  + = positive Constitutional:   No-   weight loss, night sweats, fevers, chills, fatigue, lassitude. HEENT:   No-  headaches, difficulty swallowing, tooth/dental problems, +sore throat,       No-  sneezing, itching, ear ache, nasal congestion, post nasal drip,  CV:  No-   chest pain, orthopnea, PND, swelling in lower extremities, anasarca, dizziness, palpitations Resp: +  shortness of breath with exertion or at rest.               productive cough,  +non-productive cough,  No- coughing up of blood.              No-   change in color of mucus.  No- wheezing.   Skin: No-   rash or lesions. GI:  No-   heartburn, indigestion, abdominal pain, nausea, vomiting, GU: MS:  + joint pain or swelling.  Neuro-     + chronic tremor Psych:  No- change in mood or affect. + depression or anxiety.  No memory loss.  OBJ-  Physical Exam   + odor tobacco General- Alert, Oriented, Affect + anxious, Distress- none acute, +obese,  Skin- rash-none, lesions- none, excoriation- none.  Lymphadenopathy- none Head- atraumatic            Eyes- Gross vision intact, PERRLA, conjunctivae and secretions clear,  strabismus            Ears- Hearing, canals-normal            Nose- Clear, no-Septal dev, mucus, polyps, erosion, perforation             Throat- Mallampati II-III , mucosa clear , drainage- none, tonsils- atrophic. Neck- flexible , trachea midline, no stridor , thyroid nl, carotid no bruit Chest - symmetrical excursion , unlabored           Heart/CV- RRR , no murmur , no gallop  , no rub, nl s1 s2                           - JVD- none , edema R>L trace, stasis changes- none, varices- none           Lung- + diminished but clear, wheeze- none, cough+light, dullness-none, rub- none           Chest wall-  Abd-  Br/ Gen/ Rectal- Not done, not indicated Extrem- cyanosis- none, clubbing, none, atrophy- none, strength- nl Neuro- +resting tremor/ head bob persistent

## 2022-07-25 ENCOUNTER — Encounter: Payer: Self-pay | Admitting: Internal Medicine

## 2022-07-25 ENCOUNTER — Ambulatory Visit (INDEPENDENT_AMBULATORY_CARE_PROVIDER_SITE_OTHER): Payer: Medicare Other | Admitting: Internal Medicine

## 2022-07-25 VITALS — BP 124/64 | HR 76 | Ht 67.5 in | Wt 277.4 lb

## 2022-07-25 DIAGNOSIS — J449 Chronic obstructive pulmonary disease, unspecified: Secondary | ICD-10-CM

## 2022-07-25 DIAGNOSIS — G4733 Obstructive sleep apnea (adult) (pediatric): Secondary | ICD-10-CM | POA: Diagnosis not present

## 2022-07-25 DIAGNOSIS — Z720 Tobacco use: Secondary | ICD-10-CM | POA: Diagnosis not present

## 2022-07-25 NOTE — Patient Instructions (Signed)
We can continue CPAP auto 10-20   You are doing great with this.  Please keep trying on the smoking. You have moderate COPD and we want to help where we can .\

## 2022-08-20 ENCOUNTER — Encounter: Payer: Self-pay | Admitting: Internal Medicine

## 2022-08-20 NOTE — Assessment & Plan Note (Signed)
Benefits from CPAP with good compliance and control Plan- continue auto 10-20 

## 2022-08-20 NOTE — Assessment & Plan Note (Signed)
Minimal symptoms despite long smoking hx Plan- smoking cessation

## 2022-08-20 NOTE — Assessment & Plan Note (Signed)
Smoking cessation emphasized. Support available but not willing to try.

## 2022-10-22 ENCOUNTER — Other Ambulatory Visit: Payer: Self-pay | Admitting: Internal Medicine

## 2022-10-23 ENCOUNTER — Telehealth: Payer: Self-pay | Admitting: Internal Medicine

## 2022-10-23 ENCOUNTER — Other Ambulatory Visit: Payer: Self-pay

## 2022-10-23 MED ORDER — BREZTRI AEROSPHERE 160-9-4.8 MCG/ACT IN AERO: INHALATION_SPRAY | RESPIRATORY_TRACT | 12 refills | Status: DC

## 2022-10-23 NOTE — Telephone Encounter (Signed)
Patient states needs refill for Breztri inhaler. Patient is out of medication. Pharmacy is [facility]. Patient  phone number is [phone].

## 2022-10-23 NOTE — Telephone Encounter (Signed)
Sent refills of Breztri to patients pharmacy. She is aware. NFN

## 2022-11-04 NOTE — Telephone Encounter (Signed)
RX was sent to patient's pharmacy on 3/27. Will close encounter.

## 2022-11-10 ENCOUNTER — Emergency Department (HOSPITAL_COMMUNITY)
Admission: EM | Admit: 2022-11-10 | Discharge: 2022-11-10 | Disposition: A | Discharge status: Home / Self Care | Payer: Medicare Other | Attending: Emergency Medicine | Admitting: Emergency Medicine

## 2022-11-10 ENCOUNTER — Emergency Department (HOSPITAL_COMMUNITY): Payer: Medicare Other

## 2022-11-10 ENCOUNTER — Encounter (HOSPITAL_COMMUNITY): Payer: Self-pay | Admitting: Pharmacy Technician

## 2022-11-10 ENCOUNTER — Other Ambulatory Visit: Payer: Self-pay

## 2022-11-10 DIAGNOSIS — I2510 Atherosclerotic heart disease of native coronary artery without angina pectoris: Secondary | ICD-10-CM | POA: Insufficient documentation

## 2022-11-10 DIAGNOSIS — N189 Chronic kidney disease, unspecified: Secondary | ICD-10-CM | POA: Diagnosis not present

## 2022-11-10 DIAGNOSIS — E1165 Type 2 diabetes mellitus with hyperglycemia: Secondary | ICD-10-CM | POA: Insufficient documentation

## 2022-11-10 DIAGNOSIS — J441 Chronic obstructive pulmonary disease with (acute) exacerbation: Secondary | ICD-10-CM | POA: Diagnosis not present

## 2022-11-10 DIAGNOSIS — R739 Hyperglycemia, unspecified: Secondary | ICD-10-CM

## 2022-11-10 DIAGNOSIS — Z1152 Encounter for screening for COVID-19: Secondary | ICD-10-CM | POA: Insufficient documentation

## 2022-11-10 DIAGNOSIS — Z79899 Other long term (current) drug therapy: Secondary | ICD-10-CM | POA: Diagnosis not present

## 2022-11-10 DIAGNOSIS — I129 Hypertensive chronic kidney disease with stage 1 through stage 4 chronic kidney disease, or unspecified chronic kidney disease: Secondary | ICD-10-CM | POA: Insufficient documentation

## 2022-11-10 DIAGNOSIS — R0602 Shortness of breath: Secondary | ICD-10-CM | POA: Diagnosis present

## 2022-11-10 LAB — BLOOD GAS, VENOUS
Acid-base deficit: 4.2 mmol/L — ABNORMAL HIGH (ref 0.0–2.0)
Bicarbonate: 21 mmol/L (ref 20.0–28.0)
O2 Saturation: 90.5 %
Patient temperature: 37
pCO2, Ven: 38 mmHg — ABNORMAL LOW (ref 44–60)
pH, Ven: 7.35 (ref 7.25–7.43)
pO2, Ven: 59 mmHg — ABNORMAL HIGH (ref 32–45)

## 2022-11-10 LAB — COMPREHENSIVE METABOLIC PANEL
ALT: 20 U/L (ref 0–44)
AST: 17 U/L (ref 15–41)
Albumin: 3.9 g/dL (ref 3.5–5.0)
Alkaline Phosphatase: 66 U/L (ref 38–126)
Anion gap: 11 (ref 5–15)
BUN: 24 mg/dL — ABNORMAL HIGH (ref 8–23)
CO2: 19 mmol/L — ABNORMAL LOW (ref 22–32)
Calcium: 8.8 mg/dL — ABNORMAL LOW (ref 8.9–10.3)
Chloride: 102 mmol/L (ref 98–111)
Creatinine, Ser: 1.44 mg/dL — ABNORMAL HIGH (ref 0.44–1.00)
GFR, Estimated: 39 mL/min — ABNORMAL LOW (ref >60)
Glucose, Bld: 331 mg/dL — ABNORMAL HIGH (ref 70–99)
Potassium: 4.9 mmol/L (ref 3.5–5.1)
Sodium: 132 mmol/L — ABNORMAL LOW (ref 135–145)
Total Bilirubin: 0.5 mg/dL (ref 0.3–1.2)
Total Protein: 7 g/dL (ref 6.5–8.1)

## 2022-11-10 LAB — CBC WITH DIFFERENTIAL/PLATELET
Abs Immature Granulocytes: 0.09 10*3/uL — ABNORMAL HIGH (ref 0.00–0.07)
Basophils Absolute: 0.1 10*3/uL (ref 0.0–0.1)
Basophils Relative: 0 %
Eosinophils Absolute: 0.1 10*3/uL (ref 0.0–0.5)
Eosinophils Relative: 1 %
HCT: 34.5 % — ABNORMAL LOW (ref 36.0–46.0)
Hemoglobin: 11.3 g/dL — ABNORMAL LOW (ref 12.0–15.0)
Immature Granulocytes: 1 %
Lymphocytes Relative: 8 %
Lymphs Abs: 1.3 10*3/uL (ref 0.7–4.0)
MCH: 29.4 pg (ref 26.0–34.0)
MCHC: 32.8 g/dL (ref 30.0–36.0)
MCV: 89.8 fL (ref 80.0–100.0)
Monocytes Absolute: 0.4 10*3/uL (ref 0.1–1.0)
Monocytes Relative: 2 %
Neutro Abs: 14.9 10*3/uL — ABNORMAL HIGH (ref 1.7–7.7)
Neutrophils Relative %: 88 %
Platelets: 191 10*3/uL (ref 150–400)
RBC: 3.84 MIL/uL — ABNORMAL LOW (ref 3.87–5.11)
RDW: 15.2 % (ref 11.5–15.5)
WBC: 16.9 10*3/uL — ABNORMAL HIGH (ref 4.0–10.5)
nRBC: 0 % (ref 0.0–0.2)

## 2022-11-10 LAB — SARS CORONAVIRUS 2 BY RT PCR: SARS Coronavirus 2 by RT PCR: NEGATIVE

## 2022-11-10 LAB — I-STAT CHEM 8, ED
BUN: 21 mg/dL (ref 8–23)
Calcium, Ion: 1.11 mmol/L — ABNORMAL LOW (ref 1.15–1.40)
Chloride: 102 mmol/L (ref 98–111)
Creatinine, Ser: 1.5 mg/dL — ABNORMAL HIGH (ref 0.44–1.00)
Glucose, Bld: 343 mg/dL — ABNORMAL HIGH (ref 70–99)
HCT: 31 % — ABNORMAL LOW (ref 36.0–46.0)
Hemoglobin: 10.5 g/dL — ABNORMAL LOW (ref 12.0–15.0)
Potassium: 4.8 mmol/L (ref 3.5–5.1)
Sodium: 132 mmol/L — ABNORMAL LOW (ref 135–145)
TCO2: 20 mmol/L — ABNORMAL LOW (ref 22–32)

## 2022-11-10 LAB — BRAIN NATRIURETIC PEPTIDE: B Natriuretic Peptide: 55.8 pg/mL (ref 0.0–100.0)

## 2022-11-10 LAB — MAGNESIUM: Magnesium: 1.8 mg/dL (ref 1.7–2.4)

## 2022-11-10 MED ORDER — INSULIN ASPART 100 UNIT/ML IJ SOLN
5.0000 [IU] | Freq: Once | INTRAMUSCULAR | Status: DC
  Filled 2022-11-10: qty 0.05

## 2022-11-10 MED ORDER — PREDNISONE 10 MG PO TABS: 40.0000 mg | ORAL_TABLET | Freq: Every day | ORAL | 0 refills | Status: AC

## 2022-11-10 MED ORDER — IPRATROPIUM-ALBUTEROL 0.5-2.5 (3) MG/3ML IN SOLN
3.0000 mL | Freq: Once | RESPIRATORY_TRACT | Status: AC
  Administered 2022-11-10: 3 mL via RESPIRATORY_TRACT
  Filled 2022-11-10: qty 3

## 2022-11-10 MED ORDER — LACTATED RINGERS IV BOLUS
500.0000 mL | Freq: Once | INTRAVENOUS | Status: AC
  Administered 2022-11-10: 500 mL via INTRAVENOUS

## 2022-11-10 MED ORDER — AMLODIPINE BESYLATE 5 MG PO TABS
5.0000 mg | ORAL_TABLET | Freq: Every day | ORAL | Status: DC
  Filled 2022-11-10: qty 1

## 2022-11-10 NOTE — Discharge Instructions (Addendum)
A prescription for ongoing steroids was sent to pharmacy.  Take as prescribed starting tomorrow.  Use breathing treatments at home as needed.  Your glucose was elevated today.  Long-term elevated blood sugar can cause dehydration.  Continue to stay hydrated at home.  The steroids will further increase your blood sugar.  Please follow-up with your primary care doctor for ongoing management of your diabetes.  Return to the emergency department if symptoms worsen.

## 2022-11-10 NOTE — ED Notes (Signed)
Patient requested something to eat and drink. Provider aware said its ok. Provided with graham crackers and water.

## 2022-11-10 NOTE — ED Provider Notes (Signed)
[place] EMERGENCY DEPARTMENT AT [facility] Provider Note   CSN: [id number] Arrival date & time: 11/10/22  1540     History  Chief Complaint  Patient presents with   Shortness of Breath    [name] [name] is a 67 y.o. female.  HPI Patient presents for shortness of breath.  Medical history includes anxiety, COPD, depression, HLD, HTN, sleep apnea, CAD, DM, neuropathy, CKD, chronic pain.  Onset of shortness of breath was yesterday.  She has had a cough that is productive of foul tasting sputum.  She has not had any areas of new pain.  She does have a nebulizer at home but did not use it.  She went to urgent care earlier today.  There she was given 10 mg of Decadron and nebulized albuterol.  She underwent a chest x-ray which showed concern of a left pleural effusion.  She was sent to the ED for further evaluation.  Patient reports a baseline essential tremor that is worsened with albuterol.  Currently, she endorses ongoing shortness of breath but does feel like symptoms are relieved following treatment in urgent care.  She continues to deny any areas of pain.    Home Medications Prior to Admission medications   Medication Sig Start Date End Date Taking? Authorizing Provider  predniSONE (DELTASONE) 10 MG tablet Take 4 tablets (40 mg total) by mouth daily for 4 days. 11/10/22 11/14/22 Yes Gloris Manchester, MD  albuterol (PROVENTIL) (2.5 MG/3ML) 0.083% nebulizer solution USE 1 VIAL IN NEBULIZER EVERY 6 HOURS AND AS NEEDED. Generic: VENTOLIN 11/08/19   Young, Joni Fears D, MD  albuterol (VENTOLIN HFA) 108 (90 Base) MCG/ACT inhaler Inhale 2 puffs into the lungs every 6 (six) hours as needed for wheezing or shortness of breath. wheezing 11/08/20   Jetty Duhamel D, MD  amLODipine (NORVASC) 5 MG tablet Take 5 mg by mouth daily.    [provider]  bismuth subsalicylate (PEPTO BISMOL) 262 MG/15ML suspension Take 30 mLs by mouth every 6 (six) hours as needed.    [provider]   Budeson-Glycopyrrol-Formoterol (BREZTRI AEROSPHERE) 160-9-4.8 MCG/ACT AERO Inhale 2 puffs then rinse mouth, twice daily 10/23/22   Jetty Duhamel D, MD  Cholecalciferol (VITAMIN D) 2000 units CAPS Take 1 capsule by mouth daily.    [provider]  DULoxetine (CYMBALTA) 60 MG capsule Take 60 mg by mouth every other day.     [provider]  glucose blood (ONETOUCH ULTRA) test strip CHECK SUGARS ONCE DAILY. DX: E11.40 06/28/19   [provider]  hydrochlorothiazide (HYDRODIURIL) 25 MG tablet Take 25 mg by mouth daily. 06/30/20   [provider]  LORazepam (ATIVAN) 0.5 MG tablet 1 twice daily if needed 11/08/19   Jetty Duhamel D, MD  Magnesium Oxide 500 MG TABS Take by mouth. 04/28/18   [provider]  metFORMIN (GLUCOPHAGE) 1000 MG tablet Take 1,000 mg by mouth 2 (two) times daily with a meal.    [provider]  oxybutynin (DITROPAN-XL) 10 MG 24 hr tablet Take 10 mg by mouth 2 (two) times daily.    [provider]  pregabalin (LYRICA) 100 MG capsule Take 100 mg by mouth 2 (two) times daily. 100mg  tablet in the morning. 200 mg in the evening 10/18/15   [provider]  revefenacin (YUPELRI) 175 MCG/3ML nebulizer solution Take 3 mLs (175 mcg total) by nebulization daily. Patient not taking: Reported on 01/22/2022 05/10/21   Jetty Duhamel D, MD  rosuvastatin (CRESTOR) 40 MG tablet Take 1  tablet by mouth daily. 08/08/16   [provider]  spironolactone (ALDACTONE) 25 MG tablet Take 25 mg by mouth at bedtime. 12/22/19   [provider]  traZODone (DESYREL) 150 MG tablet Take by mouth at bedtime. Take 1/3 to 1 tablet at bedtime as needed.    [provider]      Allergies    Clindamycin/lincomycin, Nicotine, Ppd [tuberculin purified protein derivative], Sulfa antibiotics, Sulfasalazine, and Penicillins    Review of Systems   Review of Systems  Constitutional:  Positive for fatigue.  Respiratory:  Positive  for cough, chest tightness, shortness of breath and wheezing.   Neurological:  Positive for tremors (Chronic).  All other systems reviewed and are negative.   Physical Exam Updated Vital Signs BP (!) 141/102   Pulse 76   Temp 98.1 F (36.7 C)   Resp (!) 22   SpO2 94%  Physical Exam Vitals and nursing note reviewed.  Constitutional:      General: She is not in acute distress.    Appearance: She is well-developed. She is not ill-appearing, toxic-appearing or diaphoretic.  HENT:     Head: Normocephalic and atraumatic.     Mouth/Throat:     Mouth: Mucous membranes are moist.  Eyes:     Conjunctiva/sclera: Conjunctivae normal.  Cardiovascular:     Rate and Rhythm: Normal rate and regular rhythm.     Heart sounds: No murmur heard. Pulmonary:     Effort: Pulmonary effort is normal. Tachypnea present. No respiratory distress.     Breath sounds: Wheezing present. No rhonchi or rales.  Chest:     Chest wall: No tenderness.  Abdominal:     Palpations: Abdomen is soft.     Tenderness: There is no abdominal tenderness.  Musculoskeletal:        General: No swelling.     Cervical back: Normal range of motion and neck supple.     Right lower leg: Edema present.     Left lower leg: Edema present.  Skin:    General: Skin is warm and dry.     Capillary Refill: Capillary refill takes less than 2 seconds.     Coloration: Skin is not cyanotic or pale.  Neurological:     General: No focal deficit present.     Mental Status: She is alert and oriented to person, place, and time.  Psychiatric:        Mood and Affect: Mood normal.        Behavior: Behavior normal.     ED Results / Procedures / Treatments   Labs (all labs ordered are listed, but only abnormal results are displayed) Labs Reviewed  COMPREHENSIVE METABOLIC PANEL - Abnormal; Notable for the following components:      Result Value   Sodium 132 (*)    CO2 19 (*)    Glucose, Bld 331 (*)    BUN 24 (*)    Creatinine, Ser  1.44 (*)    Calcium 8.8 (*)    GFR, Estimated 39 (*)    All other components within normal limits  BLOOD GAS, VENOUS - Abnormal; Notable for the following components:   pCO2, Ven 38 (*)    pO2, Ven 59 (*)    Acid-base deficit 4.2 (*)    All other components within normal limits  CBC WITH DIFFERENTIAL/PLATELET - Abnormal; Notable for the following components:   WBC 16.9 (*)    RBC 3.84 (*)    Hemoglobin 11.3 (*)  HCT 34.5 (*)    Neutro Abs 14.9 (*)    Abs Immature Granulocytes 0.09 (*)    All other components within normal limits  I-STAT CHEM 8, ED - Abnormal; Notable for the following components:   Sodium 132 (*)    Creatinine, Ser 1.50 (*)    Glucose, Bld 343 (*)    Calcium, Ion 1.11 (*)    TCO2 20 (*)    Hemoglobin 10.5 (*)    HCT 31.0 (*)    All other components within normal limits  SARS CORONAVIRUS 2 BY RT PCR  BRAIN NATRIURETIC PEPTIDE  MAGNESIUM    EKG EKG Interpretation  Date/Time:  Sunday November 10 2022 16:14:55 EDT Ventricular Rate:  85 PR Interval:  131 QRS Duration: 96 QT Interval:  378 QTC Calculation: 450 R Axis:   74 Text Interpretation: Sinus rhythm Low voltage, precordial leads Baseline wander in lead(s) II III aVF V6 Confirmed by Gloris Manchester [phone]) on 11/10/2022 4:44:58 PM  Radiology CT Chest Wo Contrast  Result Date: 11/10/2022 CLINICAL DATA:  Respiratory illness, nondiagnostic xray Wheezing. EXAM: CT CHEST WITHOUT CONTRAST TECHNIQUE: Multidetector CT imaging of the chest was performed following the standard protocol without IV contrast. RADIATION DOSE REDUCTION: This exam was performed according to the departmental dose-optimization program which includes automated exposure control, adjustment of the mA and/or kV according to patient size and/or use of iterative reconstruction technique. COMPARISON:  Most recent chest radiograph 05/10/2021. Lung cancer screening chest CT 01/03/2021 FINDINGS: Cardiovascular: Moderate aortic atherosclerosis. Aortic  tortuosity. The heart is normal in size. Mitral annulus and coronary artery calcifications. No pericardial effusion. Mediastinum/Nodes: No mediastinal adenopathy. There is no obvious hilar adenopathy on this unenhanced exam. The esophagus is decompressed. Lungs/Pleura: Mild emphysema and bronchial thickening. There is no acute airspace disease. No pleural effusion. There are multiple pulmonary nodules, largest in the anterior right upper lobe measuring 9 x 8 mm, without significant change from prior lung cancer screening CT. No obvious nodule enlargement. Upper Abdomen: The liver is partially included but appears enlarged. The spleen is partially included but appears enlarged. No acute upper abdominal findings. Musculoskeletal: Diffuse thoracic spondylosis with anterior spurring. There are no acute or suspicious osseous abnormalities. No chest wall soft tissue abnormalities. IMPRESSION: 1. No acute findings. 2. Mild emphysema. 3. Multiple pulmonary nodules, largest in the right upper lobe measuring 9 x 8 mm, unchanged from 2022 lung cancer screening CT. Recommend continued annual lung cancer screening. 4. Aortic atherosclerosis.  Coronary artery calcifications. 5. Incidental findings in the upper abdomen of mild hepatosplenomegaly. Aortic Atherosclerosis (ICD10-I70.0) and Emphysema (ICD10-J43.9). Electronically Signed   By: Narda Rutherford M.D.   On: 11/10/2022 18:59    Procedures Procedures    Medications Ordered in ED Medications  insulin aspart (novoLOG) injection 5 Units (has no administration in time range)  ipratropium-albuterol (DUONEB) 0.5-2.5 (3) MG/3ML nebulizer solution 3 mL (3 mLs Nebulization Given 11/10/22 1950)  lactated ringers bolus 500 mL (0 mLs Intravenous Stopped 11/10/22 2020)    ED Course/ Medical Decision Making/ A&P                             Medical Decision Making Amount and/or Complexity of Data Reviewed Labs: ordered. Radiology: ordered.  Risk Prescription drug  management.   This patient presents to the ED for concern of shortness of breath and wheezing, this involves an extensive number of treatment options, and is a complaint that carries with it a  high risk of complications and morbidity.  The differential diagnosis includes COPD exacerbation, pneumonia, PE, pleural effusion, allergic reaction, anxiety   Co morbidities that complicate the patient evaluation  anxiety, COPD, depression, HLD, HTN, sleep apnea, CAD, DM, neuropathy, CKD, chronic pain   Additional history obtained:  Additional history obtained from N/A External records from outside source obtained and reviewed including EMR, in addition to paperwork from urgent care   Lab Tests:  I Ordered, and personally interpreted labs.  The pertinent results include: Baseline CKD, hyperglycemia without evidence of DKA, 1 g/dL drop in hemoglobin compared to lab work from a year ago.  A leukocytosis is present.   Imaging Studies ordered:  I ordered imaging studies including CT chest I independently visualized and interpreted imaging which showed no acute findings I agree with the radiologist interpretation   Cardiac Monitoring: / EKG:  The patient was maintained on a cardiac monitor.  I personally viewed and interpreted the cardiac monitored which showed an underlying rhythm of: Sinus rhythm  Problem List / ED Course / Critical interventions / Medication management  Patient presents for shortness of breath.  It was yesterday.  She noted a audible wheeze at home.  She went to urgent care prior to arrival where she received Decadron and albuterol.  This did improve her symptoms.  She underwent chest x-ray which showed concern of a left lateral effusion.  On arrival in the ED, patient remains short of breath and does have continued wheezing on lung auscultation.  Initial heart rate is mildly elevated and patient is mildly tachypneic.  She is able to speak in complete sentences.  She has  essential tremor which she states is baseline for her.  Paperwork from urgent care was reviewed.  Lab work and CT of chest was ordered.  Given her chronic kidney disease, patient to undergo noncontrasted study.  Patient's lab work shows normal BNP.  Blood sugar is elevated in the range of 330.  IV fluids and insulin were ordered.  Patient had resolution of tachycardia and tachypnea.  Blood pressure improved while in the ED as well.  DuoNeb was ordered for ongoing treatment of COPD.  On CT of chest, there is no concerning acute finding.  Patient to undergo ongoing treatment for COPD.  She did have continued and sustained improvement in her shortness of breath while in the ED.  She does prefer discharge home.  She does have nebulized breathing treatments to use as needed at home.  Prescription for prednisone was sent to her pharmacy.  She was advised to return for any worsening of symptoms.  She was discharged in stable condition. I ordered medication including DuoNeb for COPD; IV fluids and insulin for hyperglycemia Reevaluation of the patient after these medicines showed that the patient improved I have reviewed the patients home medicines and have made adjustments as needed   Social Determinants of Health:  Has PCP         Final Clinical Impression(s) / ED Diagnoses Final diagnoses:  COPD exacerbation  Hyperglycemia    Rx / DC Orders ED Discharge Orders          Ordered    predniSONE (DELTASONE) 10 MG tablet  Daily        11/10/22 1957              Gloris Manchester, MD 11/10/22 2036

## 2022-11-10 NOTE — ED Triage Notes (Signed)
Pt here POV with reports of shob, chest tightness and wheezing since yesterday. Pt with hx COPD. Seen at [facility] and told to come to the ED for concern of fluid build up.

## 2022-11-26 ENCOUNTER — Encounter (HOSPITAL_COMMUNITY): Payer: Self-pay

## 2022-11-26 ENCOUNTER — Emergency Department (HOSPITAL_COMMUNITY)
Admission: EM | Admit: 2022-11-26 | Discharge: 2022-11-26 | Disposition: A | Discharge status: Home / Self Care | Payer: Medicare Other | Attending: Emergency Medicine | Admitting: Emergency Medicine

## 2022-11-26 ENCOUNTER — Other Ambulatory Visit: Payer: Self-pay

## 2022-11-26 ENCOUNTER — Emergency Department (HOSPITAL_COMMUNITY): Payer: Medicare Other

## 2022-11-26 DIAGNOSIS — J441 Chronic obstructive pulmonary disease with (acute) exacerbation: Secondary | ICD-10-CM | POA: Diagnosis not present

## 2022-11-26 DIAGNOSIS — R0602 Shortness of breath: Secondary | ICD-10-CM | POA: Diagnosis present

## 2022-11-26 LAB — CBC WITH DIFFERENTIAL/PLATELET
Abs Immature Granulocytes: 0.05 10*3/uL (ref 0.00–0.07)
Basophils Absolute: 0.1 10*3/uL (ref 0.0–0.1)
Basophils Relative: 0 %
Eosinophils Absolute: 0.2 10*3/uL (ref 0.0–0.5)
Eosinophils Relative: 1 %
HCT: 32.7 % — ABNORMAL LOW (ref 36.0–46.0)
Hemoglobin: 10.9 g/dL — ABNORMAL LOW (ref 12.0–15.0)
Immature Granulocytes: 0 %
Lymphocytes Relative: 19 %
Lymphs Abs: 2.7 10*3/uL (ref 0.7–4.0)
MCH: 29 pg (ref 26.0–34.0)
MCHC: 33.3 g/dL (ref 30.0–36.0)
MCV: 87 fL (ref 80.0–100.0)
Monocytes Absolute: 0.6 10*3/uL (ref 0.1–1.0)
Monocytes Relative: 5 %
Neutro Abs: 10.5 10*3/uL — ABNORMAL HIGH (ref 1.7–7.7)
Neutrophils Relative %: 75 %
Platelets: 179 10*3/uL (ref 150–400)
RBC: 3.76 MIL/uL — ABNORMAL LOW (ref 3.87–5.11)
RDW: 14.5 % (ref 11.5–15.5)
WBC: 14 10*3/uL — ABNORMAL HIGH (ref 4.0–10.5)
nRBC: 0 % (ref 0.0–0.2)

## 2022-11-26 LAB — BASIC METABOLIC PANEL
Anion gap: 12 (ref 5–15)
BUN: 24 mg/dL — ABNORMAL HIGH (ref 8–23)
CO2: 20 mmol/L — ABNORMAL LOW (ref 22–32)
Calcium: 8.9 mg/dL (ref 8.9–10.3)
Chloride: 100 mmol/L (ref 98–111)
Creatinine, Ser: 1.55 mg/dL — ABNORMAL HIGH (ref 0.44–1.00)
GFR, Estimated: 36 mL/min — ABNORMAL LOW (ref >60)
Glucose, Bld: 364 mg/dL — ABNORMAL HIGH (ref 70–99)
Potassium: 4 mmol/L (ref 3.5–5.1)
Sodium: 132 mmol/L — ABNORMAL LOW (ref 135–145)

## 2022-11-26 LAB — TROPONIN I (HIGH SENSITIVITY): Troponin I (High Sensitivity): 6 ng/L (ref <18)

## 2022-11-26 LAB — BRAIN NATRIURETIC PEPTIDE: B Natriuretic Peptide: 51.3 pg/mL (ref 0.0–100.0)

## 2022-11-26 MED ORDER — MAGNESIUM SULFATE 2 GM/50ML IV SOLN
2.0000 g | Freq: Once | INTRAVENOUS | Status: AC
  Administered 2022-11-26: 2 g via INTRAVENOUS
  Filled 2022-11-26: qty 50

## 2022-11-26 MED ORDER — PREDNISONE 20 MG PO TABS: ORAL_TABLET | ORAL | 0 refills | Status: DC

## 2022-11-26 MED ORDER — METHYLPREDNISOLONE SODIUM SUCC 125 MG IJ SOLR
125.0000 mg | Freq: Once | INTRAMUSCULAR | Status: AC
  Administered 2022-11-26: 125 mg via INTRAVENOUS
  Filled 2022-11-26: qty 2

## 2022-11-26 MED ORDER — IPRATROPIUM-ALBUTEROL 0.5-2.5 (3) MG/3ML IN SOLN
3.0000 mL | RESPIRATORY_TRACT | Status: AC
  Administered 2022-11-26 (×3): 3 mL via RESPIRATORY_TRACT
  Filled 2022-11-26: qty 9

## 2022-11-26 NOTE — Discharge Instructions (Signed)
You can use your treatments up to every 4 hours that you are awake.  If you have to use it more often than he should come back to the emergency department to be reassessed.  I have started you on steroids this will make your blood sugar go up.  Please call your endocrinologist tomorrow to see if they want to make any medication changes, they may recommend that you take your glipizide regularly.

## 2022-11-26 NOTE — ED Triage Notes (Signed)
Sent by UC for wheezing and SOB. Pt states she is having a COPD exacerbation and cannot get put on steroids due to blood sugar reading HI. Denies pain

## 2022-11-26 NOTE — ED Provider Notes (Signed)
[place] EMERGENCY DEPARTMENT AT [facility] Provider Note   CSN: [id number] Arrival date & time: 11/26/22  1515     History  Chief Complaint  Patient presents with   Wheezing   Shortness of Breath    [name] [name] is a 67 y.o. female.  67 yo F with a chief complaints of difficulty breathing cough.  This been going on for about a week.  She has a history of COPD and feels this is similar.  She denies any fevers denies any sick contacts.  Her chest is a bit sore from all the coughing but denies any pain or pressure otherwise.  She has been using her nebulizer at home and has been doubling up on her dose without significant improvement.  She think she needs steroids but is worried because she has had trouble controlling her blood sugar.  She sees endocrinology for that.   Wheezing Associated symptoms: shortness of breath   Shortness of Breath Associated symptoms: wheezing        Home Medications Prior to Admission medications   Medication Sig Start Date End Date Taking? Authorizing Provider  predniSONE (DELTASONE) 20 MG tablet 2 tabs po daily x 4 days 11/26/22  Yes Melene Plan, DO  albuterol (PROVENTIL) (2.5 MG/3ML) 0.083% nebulizer solution USE 1 VIAL IN NEBULIZER EVERY 6 HOURS AND AS NEEDED. Generic: VENTOLIN 11/08/19   Young, Joni Fears D, MD  albuterol (VENTOLIN HFA) 108 (90 Base) MCG/ACT inhaler Inhale 2 puffs into the lungs every 6 (six) hours as needed for wheezing or shortness of breath. wheezing 11/08/20   Jetty Duhamel D, MD  amLODipine (NORVASC) 5 MG tablet Take 5 mg by mouth daily.    [provider]  bismuth subsalicylate (PEPTO BISMOL) 262 MG/15ML suspension Take 30 mLs by mouth every 6 (six) hours as needed.    [provider]  Budeson-Glycopyrrol-Formoterol (BREZTRI AEROSPHERE) 160-9-4.8 MCG/ACT AERO Inhale 2 puffs then rinse mouth, twice daily 10/23/22   Jetty Duhamel D, MD  Cholecalciferol (VITAMIN D) 2000 units CAPS Take 1 capsule by mouth  daily.    [provider]  DULoxetine (CYMBALTA) 60 MG capsule Take 60 mg by mouth every other day.     [provider]  glucose blood (ONETOUCH ULTRA) test strip CHECK SUGARS ONCE DAILY. DX: E11.40 06/28/19   [provider]  hydrochlorothiazide (HYDRODIURIL) 25 MG tablet Take 25 mg by mouth daily. 06/30/20   [provider]  LORazepam (ATIVAN) 0.5 MG tablet 1 twice daily if needed 11/08/19   Jetty Duhamel D, MD  Magnesium Oxide 500 MG TABS Take by mouth. 04/28/18   [provider]  metFORMIN (GLUCOPHAGE) 1000 MG tablet Take 1,000 mg by mouth 2 (two) times daily with a meal.    [provider]  oxybutynin (DITROPAN-XL) 10 MG 24 hr tablet Take 10 mg by mouth 2 (two) times daily.    [provider]  pregabalin (LYRICA) 100 MG capsule Take 100 mg by mouth 2 (two) times daily. 100mg  tablet in the morning. 200 mg in the evening 10/18/15   [provider]  revefenacin (YUPELRI) 175 MCG/3ML nebulizer solution Take 3 mLs (175 mcg total) by nebulization daily. Patient not taking: Reported on 01/22/2022 05/10/21   Jetty Duhamel D, MD  rosuvastatin (CRESTOR) 40 MG tablet Take 1 tablet by mouth daily. 08/08/16   [provider]  spironolactone (ALDACTONE) 25 MG tablet Take 25 mg by mouth at bedtime. 12/22/19   [provider]  traZODone (DESYREL)  150 MG tablet Take by mouth at bedtime. Take 1/3 to 1 tablet at bedtime as needed.    [provider]      Allergies    Clindamycin/lincomycin, Nicotine, Ppd [tuberculin purified protein derivative], Sulfa antibiotics, Sulfasalazine, and Penicillins    Review of Systems   Review of Systems  Respiratory:  Positive for shortness of breath and wheezing.     Physical Exam Updated Vital Signs BP 126/61 (BP Location: Left Arm)   Pulse 80   Temp 98.7 F (37.1 C) (Oral)   Resp 18   Ht 5\' 8"  (1.727 m)   Wt 126 kg   SpO2 100%   BMI 42.24 kg/m  Physical Exam Vitals  and nursing note reviewed.  Constitutional:      General: She is not in acute distress.    Appearance: She is well-developed. She is not diaphoretic.     Comments: BMI 42  HENT:     Head: Normocephalic and atraumatic.  Eyes:     Pupils: Pupils are equal, round, and reactive to light.  Cardiovascular:     Rate and Rhythm: Normal rate and regular rhythm.     Heart sounds: No murmur heard.    No friction rub. No gallop.  Pulmonary:     Effort: Pulmonary effort is normal.     Breath sounds: Wheezing present. No rales.     Comments: Tachypnea prolonged expiratory effort, diffuse wheezes Abdominal:     General: There is no distension.     Palpations: Abdomen is soft.     Tenderness: There is no abdominal tenderness.  Musculoskeletal:        General: No tenderness.     Cervical back: Normal range of motion and neck supple.  Skin:    General: Skin is warm and dry.  Neurological:     Mental Status: She is alert and oriented to person, place, and time.  Psychiatric:        Behavior: Behavior normal.     ED Results / Procedures / Treatments   Labs (all labs ordered are listed, but only abnormal results are displayed) Labs Reviewed  CBC WITH DIFFERENTIAL/PLATELET - Abnormal; Notable for the following components:      Result Value   WBC 14.0 (*)    RBC 3.76 (*)    Hemoglobin 10.9 (*)    HCT 32.7 (*)    Neutro Abs 10.5 (*)    All other components within normal limits  BASIC METABOLIC PANEL - Abnormal; Notable for the following components:   Sodium 132 (*)    CO2 20 (*)    Glucose, Bld 364 (*)    BUN 24 (*)    Creatinine, Ser 1.55 (*)    GFR, Estimated 36 (*)    All other components within normal limits  BRAIN NATRIURETIC PEPTIDE  TROPONIN I (HIGH SENSITIVITY)    EKG None  Radiology DG Chest Port 1 View  Result Date: 11/26/2022 CLINICAL DATA:  Shortness of breath EXAM: PORTABLE CHEST 1 VIEW COMPARISON:  Chest x-ray dated May 10, 2021 FINDINGS: Cardiac and  mediastinal contours are within normal limits. Mild bibasilar opacities, likely atelectasis. No evidence of pleural effusion or pneumothorax. IMPRESSION: Mild bibasilar opacities, likely atelectasis. Electronically Signed   By: Allegra Lai M.D.   On: 11/26/2022 16:08    Procedures Procedures    Medications Ordered in ED Medications  ipratropium-albuterol (DUONEB) 0.5-2.5 (3) MG/3ML nebulizer solution 3 mL (3 mLs Nebulization Given 11/26/22 1548)  methylPREDNISolone sodium succinate (  SOLU-MEDROL) 125 mg/2 mL injection 125 mg (125 mg Intravenous Given 11/26/22 1545)  magnesium sulfate IVPB 2 g 50 mL (0 g Intravenous Stopped 11/26/22 1613)    ED Course/ Medical Decision Making/ A&P                             Medical Decision Making Amount and/or Complexity of Data Reviewed Labs: ordered. Radiology: ordered. ECG/medicine tests: ordered.  Risk Prescription drug management.   67 yo F with a chief complaints of cough and difficulty breathing going on for the past week.  Feels similar to her prior COPD exacerbations.  Has been doubling up on her nebulizer at home without improvement.  Will obtain a chest x-ray blood work 3 DuoNebs back-to-back mag and Solu-Medrol. Reassess  Chest x-ray independently interpreted by me without focal infiltrate or pneumothorax.  No acute anemia, troponin negative, no significant electrolyte abnormality.  Renal function at baseline.  On reassessment the patient is feeling much better and would like to go home.  I feel that the benefit of steroids outweigh the risk of hyperglycemia.  Encouraged her to call her PCP and/or endocrinologist tomorrow to discuss possible medication changes.  She is on glipizide but only takes it as needed.  4:51 PM:  I have discussed the diagnosis/risks/treatment options with the patient.  Evaluation and diagnostic testing in the emergency department does not suggest an emergent condition requiring admission or immediate  intervention beyond what has been performed at this time.  They will follow up with PCP, endo. We also discussed returning to the ED immediately if new or worsening sx occur. We discussed the sx which are most concerning (e.g., sudden worsening sob, fever, inability to tolerate by mouth) that necessitate immediate return. Medications administered to the patient during their visit and any new prescriptions provided to the patient are listed below.  Medications given during this visit Medications  ipratropium-albuterol (DUONEB) 0.5-2.5 (3) MG/3ML nebulizer solution 3 mL (3 mLs Nebulization Given 11/26/22 1548)  methylPREDNISolone sodium succinate (SOLU-MEDROL) 125 mg/2 mL injection 125 mg (125 mg Intravenous Given 11/26/22 1545)  magnesium sulfate IVPB 2 g 50 mL (0 g Intravenous Stopped 11/26/22 1613)     The patient appears reasonably screen and/or stabilized for discharge and I doubt any other medical condition or other [facility] requiring further screening, evaluation, or treatment in the ED at this time prior to discharge.          Final Clinical Impression(s) / ED Diagnoses Final diagnoses:  COPD exacerbation (HCC)    Rx / DC Orders ED Discharge Orders          Ordered    predniSONE (DELTASONE) 20 MG tablet        11/26/22 1649              Melene Plan, DO 11/26/22 1651

## 2022-11-29 ENCOUNTER — Other Ambulatory Visit: Payer: Self-pay

## 2023-08-24 NOTE — Progress Notes (Addendum)
HPI  female smoker followed for OSA, COPD with history pneumonia, tobacco abuse, Chronic restrictive lung disease, lung nodules, Complicated by HBP, Atopic dermatitis,  NPSG 01/04/03 [place] Severe OSA, RDI.( AHI )32.3/hr. [place] 11/24, weight 270 lbs. PFT 08/26/2011-restriction. FVC 68%, FEV1 62%, FEV1/FVC 0.93. Office Spirometry 02/24/2014-mild obstruction and restriction of exhaled volume. FVC 2.64/72%, FEV1 1.80/63%, FEV1/FVC 0.68, FEF 25-75% 0.92/35% Office Spirometry 10/19/2015-mild obstruction with mild restriction of exhaled volume. FVC 2.62/73%, FEV1 1.92/69%, ratio 0.73, FEF 25-75 percent 1.35/54% CT chest 01/15/17, 04/11/17 Anterior right upper lobe nodule, multiple bilateral small upper lobe nodules Office Spirometry 07/13/18-mild obstruction, mild restriction of FVC.  FVC 2.4/63%, FEV1 1.7/60%, ratio 1.73, FEF 25-75% 1.3/52% Walk test O2 qualifying 01/22/2022-lowest saturation 98% on room air with maximum heart rate 100.  Does not qualify. PFT 04/26/22- mod obst, no resp. Nl Volume and DLCO -----------------------------------------------------------------------------------------   07/25/22- 50 year-old female Smoker(Still 1.5 ppd/70 pk yrs) followed for OSA, COPD with history pneumonia, tobacco abuse, Chronic restrictive lung disease, lung nodules, Complicated by HBP, Atopic dermatitis, DM2, CAD, Aortic Atherosclerosis,  - neb albuterol, Ventolin hfa, Neb Yupelri (not tried), Neb albuterol, Breztri, Trazodone 150, CPAP auto 10-20 [employer] Download-compliance -100%, AHI 0.7/ hr Body weight today-277 lbs Covid vax-6 Phizer Flu vax-had PFT 04/26/22- mod obst, no resp. Nl Volume and DLCO -----Pt states she is doing pretty good  Still smoking 1.5 ppd- discussed Trying to care for mother w/ Alzheimer's. Download reviewed. Doing fine with CPAP.  08/26/23- 68 year-old female Smoker(Still 1.5 ppd/70 pk yrs) followed for OSA, COPD with history pneumonia, tobacco abuse, Chronic restrictive lung  disease, lung nodules, Complicated by HBP, Atopic dermatitis, DM2, CAD, Aortic Atherosclerosis,  - neb albuterol, Ventolin hfa, Breztri, Trazodone 150, CPAP auto 10-20 [employer] Download-compliance 100%, AHI 0.5/hr Body weight today-275 l bs CT chest 414/24 low dose screeen IMPRESSION: 1. No acute findings. 2. Mild emphysema. 3. Multiple pulmonary nodules, largest in the right upper lobe measuring 9 x 8 mm, unchanged from 2022 lung cancer screening CT. Recommend continued annual lung cancer screening. 4. Aortic atherosclerosis.  Coronary artery calcifications. 5. Incidental findings in the upper abdomen of mild hepatosplenomegaly. Aortic Atherosclerosis (ICD10-I70.0) and Emphysema (ICD10-J43.9). Discussed the use of AI scribe software for clinical note transcription with the patient, who gave verbal consent to proceed.  History of Present Illness   The patient, with a history of smoking and chronic low back pain, presents with hoarseness and a worsening of her back pain. She reports a lot of hoarseness, which seems to worsen with more talking. She denies pain with swallowing but has been choking a lot. She has been seen by a pain clinic for her back pain, where she was recommended to have shots in the SI joint and possibly another ablation series due to arthritis. However, she is hesitant due to the pain of the procedure.   CPAP is going well, with good compliance and control of her sleep apnea.  The patient also reports that she is considering quitting smoking due to morning coughing and nose blowing. She notes that her breathing improves after taking Breztri, but she then feels able to smoke again, which she recognizes as self-defeating. She is currently participating in a low dose screening CT program for her smoking history. Smoking cessation emphasized.  In addition, the patient has been managing her sleep apnea well and has recently started on insulin for diabetes. She also has a  continuous glucose monitor, but it fell off after the second day of use.  ROS-see HPI  + = positive Constitutional:   No-   weight loss, night sweats, fevers, chills, fatigue, lassitude. HEENT:   No-  headaches, difficulty swallowing, tooth/dental problems, +sore throat,       No-  sneezing, itching, ear ache, nasal congestion, post nasal drip,  CV:  No-   chest pain, orthopnea, PND, swelling in lower extremities, anasarca, dizziness, palpitations Resp: +  shortness of breath with exertion or at rest.               productive cough,  +non-productive cough,  No- coughing up of blood.              No-   change in color of mucus.  No- wheezing.   Skin: No-   rash or lesions. GI:  No-   heartburn, indigestion, abdominal pain, nausea, vomiting, GU: MS:  + joint pain or swelling.  Neuro-     + chronic tremor Psych:  No- change in mood or affect. + depression or anxiety.  No memory loss.  OBJ- Physical Exam   + odor tobacco General- Alert, Oriented, Affect + anxious, Distress- none acute, +obese,  Skin- rash-none, lesions- none, excoriation- none.  Lymphadenopathy- none Head- atraumatic            Eyes- Gross vision intact, PERRLA, conjunctivae and secretions clear, strabismus            Ears- Hearing, canals-normal            Nose- Clear, no-Septal dev, mucus, polyps, erosion, perforation             Throat- Mallampati II-III , mucosa clear , drainage- none, tonsils- atrophic. Neck- flexible , trachea midline, no stridor , thyroid nl, carotid no bruit Chest - symmetrical excursion , unlabored           Heart/CV- RRR , no murmur , no gallop  , no rub, nl s1 s2                           - JVD- none , edema R>L trace, stasis changes- none, varices- none           Lung- + diminished but clear, wheeze- none, cough+light, dullness-none, rub- none           Chest wall-  Abd-  Br/ Gen/ Rectal- Not done, not indicated Extrem- cyanosis- none, clubbing, none, atrophy- none, strength-  nl Neuro- +resting tremor/ head bob persistent Assessment and Plan    Chronic Low Back Pain Considering SI joint injections and ablation for arthritis-related pain. Patient is hesitant due to previous painful experience. -Decision to proceed with injections and ablation is pending patient's decision.  Chronic Obstructive Pulmonary Disease (COPD) Reports improvement with Breztri. Patient acknowledges need to quit smoking due to worsening symptoms in the morning. -Continue Breztri. -Encourage smoking cessation.  Hoarseness and Choking Increased hoarseness and episodes of choking. No dysphagia. -Refer to Ear, Nose, and Throat (ENT) for evaluation of vocal cords.  Lung Cancer Screening Participating in low-dose CT screening program. Last scan in April 2024 was stable. -Continue with annual low-dose CT scan. Next scan due in April 2025.  Sleep Apnea Well-managed with current CPAP. -Continue current treatment.   Hoarseness- chronic smoker -referral to ENT, anticipating laryngoscopy  Diabetes Mellitus Recently started on insulin. Unable to perform A1c due to presence of [place] hemoglobin. Continuous glucose monitor fell off. -Advise patient to contact provider for replacement of continuous glucose monitor. -  Continue insulin as prescribed by primary care provider.

## 2023-08-26 ENCOUNTER — Ambulatory Visit: Payer: Medicare Other | Admitting: Internal Medicine

## 2023-08-26 ENCOUNTER — Other Ambulatory Visit: Payer: Self-pay | Admitting: *Deleted

## 2023-08-26 ENCOUNTER — Encounter: Payer: Self-pay | Admitting: Internal Medicine

## 2023-08-26 VITALS — BP 130/70 | HR 68 | Ht 67.5 in | Wt 275.6 lb

## 2023-08-26 DIAGNOSIS — F17210 Nicotine dependence, cigarettes, uncomplicated: Secondary | ICD-10-CM

## 2023-08-26 DIAGNOSIS — J449 Chronic obstructive pulmonary disease, unspecified: Secondary | ICD-10-CM

## 2023-08-26 DIAGNOSIS — R490 Dysphonia: Secondary | ICD-10-CM | POA: Diagnosis not present

## 2023-08-26 DIAGNOSIS — E119 Type 2 diabetes mellitus without complications: Secondary | ICD-10-CM

## 2023-08-26 DIAGNOSIS — G4733 Obstructive sleep apnea (adult) (pediatric): Secondary | ICD-10-CM

## 2023-08-26 DIAGNOSIS — Z87891 Personal history of nicotine dependence: Secondary | ICD-10-CM

## 2023-08-26 DIAGNOSIS — Z122 Encounter for screening for malignant neoplasm of respiratory organs: Secondary | ICD-10-CM

## 2023-08-26 NOTE — Patient Instructions (Addendum)
Order- referral to [facility] ENT   dx hoarseness  We can continue CPAP auto 10-20  We can continue current meds- call for refills if needed  It is time to really try to stp smoking  You will be called when it is time to schedule your updated low dose screening chest CT scan.

## 2023-09-09 ENCOUNTER — Encounter: Payer: Self-pay | Admitting: Internal Medicine

## 2023-09-11 ENCOUNTER — Institutional Professional Consult (permissible substitution) (INDEPENDENT_AMBULATORY_CARE_PROVIDER_SITE_OTHER): Payer: Medicare Other | Admitting: Otolaryngology

## 2023-09-22 ENCOUNTER — Encounter (HOSPITAL_COMMUNITY): Payer: Self-pay

## 2023-09-22 ENCOUNTER — Emergency Department (HOSPITAL_COMMUNITY)
Admission: EM | Admit: 2023-09-22 | Discharge: 2023-09-22 | Discharge status: Against Medical Advice | Payer: Medicare Other | Attending: Emergency Medicine | Admitting: Emergency Medicine

## 2023-09-22 ENCOUNTER — Other Ambulatory Visit: Payer: Self-pay

## 2023-09-22 DIAGNOSIS — R110 Nausea: Secondary | ICD-10-CM | POA: Insufficient documentation

## 2023-09-22 DIAGNOSIS — R109 Unspecified abdominal pain: Secondary | ICD-10-CM | POA: Diagnosis present

## 2023-09-22 DIAGNOSIS — R194 Change in bowel habit: Secondary | ICD-10-CM | POA: Diagnosis not present

## 2023-09-22 DIAGNOSIS — Z5321 Procedure and treatment not carried out due to patient leaving prior to being seen by health care provider: Secondary | ICD-10-CM | POA: Diagnosis not present

## 2023-09-22 LAB — COMPREHENSIVE METABOLIC PANEL
ALT: 16 U/L (ref 0–44)
AST: 13 U/L — ABNORMAL LOW (ref 15–41)
Albumin: 4.3 g/dL (ref 3.5–5.0)
Alkaline Phosphatase: 56 U/L (ref 38–126)
Anion gap: 11 (ref 5–15)
BUN: 28 mg/dL — ABNORMAL HIGH (ref 8–23)
CO2: 19 mmol/L — ABNORMAL LOW (ref 22–32)
Calcium: 9.7 mg/dL (ref 8.9–10.3)
Chloride: 104 mmol/L (ref 98–111)
Creatinine, Ser: 1.51 mg/dL — ABNORMAL HIGH (ref 0.44–1.00)
GFR, Estimated: 37 mL/min — ABNORMAL LOW (ref >60)
Glucose, Bld: 178 mg/dL — ABNORMAL HIGH (ref 70–99)
Potassium: 4 mmol/L (ref 3.5–5.1)
Sodium: 134 mmol/L — ABNORMAL LOW (ref 135–145)
Total Bilirubin: 1 mg/dL (ref 0.0–1.2)
Total Protein: 8 g/dL (ref 6.5–8.1)

## 2023-09-22 LAB — CBC WITH DIFFERENTIAL/PLATELET
Abs Immature Granulocytes: 0.04 10*3/uL (ref 0.00–0.07)
Basophils Absolute: 0.1 10*3/uL (ref 0.0–0.1)
Basophils Relative: 1 %
Eosinophils Absolute: 0.2 10*3/uL (ref 0.0–0.5)
Eosinophils Relative: 1 %
HCT: 39.3 % (ref 36.0–46.0)
Hemoglobin: 13 g/dL (ref 12.0–15.0)
Immature Granulocytes: 0 %
Lymphocytes Relative: 17 %
Lymphs Abs: 2.3 10*3/uL (ref 0.7–4.0)
MCH: 28.6 pg (ref 26.0–34.0)
MCHC: 33.1 g/dL (ref 30.0–36.0)
MCV: 86.4 fL (ref 80.0–100.0)
Monocytes Absolute: 0.7 10*3/uL (ref 0.1–1.0)
Monocytes Relative: 5 %
Neutro Abs: 10 10*3/uL — ABNORMAL HIGH (ref 1.7–7.7)
Neutrophils Relative %: 76 %
Platelets: 215 10*3/uL (ref 150–400)
RBC: 4.55 MIL/uL (ref 3.87–5.11)
RDW: 15.8 % — ABNORMAL HIGH (ref 11.5–15.5)
WBC: 13.3 10*3/uL — ABNORMAL HIGH (ref 4.0–10.5)
nRBC: 0 % (ref 0.0–0.2)

## 2023-09-22 LAB — LIPASE, BLOOD: Lipase: 42 U/L (ref 11–51)

## 2023-09-22 NOTE — ED Triage Notes (Signed)
 Pt reports with abdominal pain and nausea since today. Pt reports increased bowel movements with no appetite.

## 2023-10-17 ENCOUNTER — Telehealth: Payer: Self-pay | Admitting: Internal Medicine

## 2023-10-17 NOTE — Telephone Encounter (Signed)
 Ok to DC order for ENT referral since patient doesn't feel she needs it.

## 2023-10-17 NOTE — Telephone Encounter (Signed)
 Called patient.  Patient stopped smoking about a month ago.  Patient has no hoarseness or choking now.  Patient states she wanted to see an ENT due to this and get a possible laryngoscopy.  Patient does not feel she needs to pursue this any longer.  She would like to get Dr. Roxy Cedar input.  Dr. Maple Hudson, please advise.  Thank you.

## 2023-10-17 NOTE — Telephone Encounter (Signed)
 Patient has stopped smoking and is wondering if she still needs to go to the ENT doctors since she is no longer horse. Please call [phone]

## 2023-10-20 NOTE — Telephone Encounter (Signed)
 Called patient.  Gave information per Dr. Maple Hudson.  Patient verbalized understanding.

## 2023-10-31 ENCOUNTER — Institutional Professional Consult (permissible substitution) (INDEPENDENT_AMBULATORY_CARE_PROVIDER_SITE_OTHER): Payer: Medicare Other | Admitting: Otolaryngology

## 2023-12-16 ENCOUNTER — Inpatient Hospital Stay: Admission: RE | Admit: 2023-12-16 | Source: Ambulatory Visit

## 2023-12-20 ENCOUNTER — Other Ambulatory Visit: Payer: Self-pay | Admitting: Internal Medicine

## 2023-12-23 ENCOUNTER — Ambulatory Visit: Payer: Self-pay | Admitting: Internal Medicine

## 2023-12-23 NOTE — Telephone Encounter (Signed)
 E2C2 Pulmonary Triage - Initial Assessment Questions "Chief Complaint (e.g., cough, sob, wheezing, fever, chills, sweat or additional symptoms) *Go to specific symptom protocol after initial questions. Patient with hx of OSA and COPD calling with complaints of shortness of breath, wheezing and dizziness that started over the weekend. Patient reports she is out of her Breztri  inhaler and called her pharmacy on Friday for a refill. Slight cough. No fever or chills. Patient also endorses some stomach issues that have been going on since being started on protonix  by GI doctor. Patient states she hasn't used her nebulizer but patient states she doesn't like to use it due to her natural tremor. Patient states she just wants her Breztri  refilled and asking for it to be sent today. Patient states her pharmacy doesn't keep Breztri  in stock-if sent today she won't get the inhaler until tomorrow. Patient denies any increase in usual symptoms. Patient is wanting her inhaler to be refilled. Patient would like a follow up call from staff. After speaking with patient, reviewed medication list and found that Breztri  was refilled yesterday. Patient was called back and instructed to follow up with pharmacy. Patient states she will call back if prescription never made it to pharmacy.    "How long have symptoms been present?" Started over the weekend but patient feels it is due to not having Breztri  inhaler  Have you tested for COVID or Flu? Note: If not, ask patient if a home test can be taken. If so, instruct patient to call back for positive results. No  MEDICINES:   "Have you used any OTC meds to help with symptoms?" No If yes, ask "What medications?"   "Have you used your inhalers/maintenance medication?" Yes If yes, "What medications?" Albuterol  inhaler/nebulizer Breztri   If inhaler, ask "How many puffs and how often?" Note: Review instructions on medication in the chart. Breztri  2 puffs BID  OXYGEN: "Do  you wear supplemental oxygen?" No If yes, "How many liters are you supposed to use?"   "Do you monitor your oxygen levels?" No If yes, "What is your reading (oxygen level) today?"   "What is your usual oxygen saturation reading?"  (Note: Pulmonary O2 sats should be 90% or greater) Patient doesn't monitor her oxygen saturation.    Copied from CRM [id number]. Topic: Clinical - Red Word Triage >> Dec 23, 2023  8:28 AM Ilene Malling wrote: Red Word that prompted transfer to Nurse Triage: Patient [phone] states has been up all night from using nebulizer, doesn't do as good of job. Patient is out of medication BREZTRI  AEROSPHERE 160-9-4.8 MCG/ACT AERO inhale since Friday. [facility]. Patient states wheezing, shortness of breath, dizziness, stomach issues maybe due to medication, and nausea. Patient denies fever, pain, nor headaches. Patient see Dr. Linder Revere, please advise.   [id number], [place] - [address] AT [address] [place] [address] Phone: [phone] Fax: [phone] Reason for Disposition  [1] Longstanding difficulty breathing (e.g., CHF, COPD, emphysema) AND [2] WORSE than normal  Answer Assessment - Initial Assessment Questions 1. RESPIRATORY STATUS: "Describe your breathing?" (e.g., wheezing, shortness of breath, unable to speak, severe coughing)      Shortness of breath 2. ONSET: "When did this breathing problem begin?"      Over the weekend but patient believes symptoms started after not having Breztri  inhaler.  3. PATTERN "Does the difficult breathing come and go, or has it been constant since it started?"  intermittent 4. SEVERITY: "How bad is your breathing?" (e.g., mild, moderate, severe)    - MILD: No SOB at rest, mild SOB with walking, speaks normally in sentences, can lie down, no retractions, pulse < 100.    - MODERATE: SOB at rest, SOB with minimal exertion and prefers to sit, cannot lie down flat,  speaks in phrases, mild retractions, audible wheezing, pulse 100-120.    - SEVERE: Very SOB at rest, speaks in single words, struggling to breathe, sitting hunched forward, retractions, pulse > 120      Mild 5. RECURRENT SYMPTOM: "Have you had difficulty breathing before?" If Yes, ask: "When was the last time?" and "What happened that time?"       6. CARDIAC HISTORY: "Do you have any history of heart disease?" (e.g., heart attack, angina, bypass surgery, angioplasty)       7. LUNG HISTORY: "Do you have any history of lung disease?"  (e.g., pulmonary embolus, asthma, emphysema)     COPD, OSA 8. CAUSE: "What do you think is causing the breathing problem?"      Breztri  inhaler ran out-needs a refill per patient 9. OTHER SYMPTOMS: "Do you have any other symptoms? (e.g., dizziness, runny nose, cough, chest pain, fever)     Slight cough  Protocols used: Breathing Difficulty-A-AH

## 2024-06-21 ENCOUNTER — Other Ambulatory Visit: Payer: Self-pay | Admitting: Internal Medicine

## 2024-06-21 NOTE — Telephone Encounter (Unsigned)
 Copied from CRM #[id number]. Topic: Clinical - Medication Refill >> Jun 21, 2024  3:14 PM Dustin F wrote: Medication: albuterol  (VENTOLIN  HFA) 108 (90 Base) MCG/ACT inhaler   Has the patient contacted their pharmacy? Yes (Agent: If no, request that the patient contact the pharmacy for the refill. If patient does not wish to contact the pharmacy document the reason why and proceed with request.) (Agent: If yes, when and what did the pharmacy advise?)  This is the patient's preferred pharmacy:  [id number] [place], [place] - [address] AT [facility] [address] [place] [address] Phone: [phone] Fax: [phone]  Is this the correct pharmacy for this prescription? Yes If no, delete pharmacy and type the correct one.   Has the prescription been filled recently? No  Is the patient out of the medication? Yes  Has the patient been seen for an appointment in the last year OR does the patient have an upcoming appointment? Yes  Can we respond through MyChart? Yes  Agent: Please be advised that Rx refills may take up to 3 business days. We ask that you follow-up with your pharmacy.

## 2024-06-23 MED ORDER — ALBUTEROL SULFATE HFA 108 (90 BASE) MCG/ACT IN AERS: 2.0000 | INHALATION_SPRAY | Freq: Four times a day (QID) | RESPIRATORY_TRACT | 12 refills | Status: DC | PRN

## 2024-08-02 ENCOUNTER — Ambulatory Visit: Payer: Self-pay

## 2024-08-02 ENCOUNTER — Ambulatory Visit: Admitting: Primary Care

## 2024-08-02 NOTE — Telephone Encounter (Signed)
 [facility] Pulmonary Triage - Initial Assessment Questions Chief Complaint (e.g., cough, sob, wheezing, fever, chills, sweat or additional symptoms) *Go to specific symptom protocol after initial questions. Sob wheezing  How long have symptoms been present? several days   Have you tested for COVID or Flu? Note: If not, ask patient if a home test can be taken. If so, instruct patient to call back for positive results. No  MEDICINES:   Have you used any OTC meds to help with symptoms? No If yes, ask What medications?   Have you used your inhalers/maintenance medication? Yes If yes, What medications? albuterol   If inhaler, ask How many puffs and how often? Note: Review instructions on medication in the chart. 2 puffs, q2-3 hrs  OXYGEN: Do you wear supplemental oxygen? No If yes, How many liters are you supposed to use?   Do you monitor your oxygen levels? No If yes, What is your reading (oxygen level) today?   What is your usual oxygen saturation reading?  (Note: Pulmonary O2 sats should be 90% or greater)  Pt calling PCP for earlier appt.   Copied from CRM #[id number]. Topic: Clinical - Red Word Triage >> Aug 02, 2024  2:10 PM Whitney O wrote: [facility] that prompted transfer to Nurse Triage: shortness of breath having to use trescue inhaler more with breztri  inhaler . Wants to see provider . Dr young  [facility] in [place] Reason for Disposition  [1] Mild wheezing comes and goes AND [2] present > 3 days  Answer Assessment - Initial Assessment Questions 1. RESPIRATORY STATUS: Describe your breathing? (e.g., wheezing, shortness of breath, unable to speak, severe coughing)      SOB at times when wheezing  3. TRIGGER: What do you think triggered this attack? (e.g., URI, exposure to pollen or other allergen, tobacco smoke)      unknown 4. PEAK EXPIRATORY FLOW RATE (PEFR): Do you use a peak flow meter? If Yes, ask: What's the current peak flow?  What's your personal best peak flow?        6. ASTHMA MEDICINES:  What treatments have you tried?      albuterol  7. INHALED QUICK-RELIEF TREATMENTS FOR THIS ATTACK: What treatments have you given yourself so far? and How many and how often? If using an inhaler, ask, How many puffs? Note: Routine treatments are 2 puffs every 4 hours as needed. Rescue treatments are 4 puffs repeated every 20 minutes, up to three times as needed.      Albuterol  2 puffs q 2-3 hrs 8. OTHER SYMPTOMS: Do you have any other symptoms? (e.g., chest pain, coughing up yellow sputum, fever, runny nose)     denies 9. O2 SATURATION MONITOR:  Do you use an oxygen saturation monitor (pulse oximeter) at home? If Yes, What is your reading (oxygen level) today? What is your usual oxygen saturation reading? (e.g., 95%)      10. PREGNANCY: Is there any chance you are pregnant? When was your last menstrual period?  Protocols used: Asthma Attack-A-AH

## 2024-08-02 NOTE — Telephone Encounter (Signed)
 Called and got patient scheduled to see beth tomorrow for an acute visit.NFN

## 2024-08-05 ENCOUNTER — Ambulatory Visit: Admitting: Adult Health

## 2024-08-05 ENCOUNTER — Encounter: Payer: Self-pay | Admitting: Adult Health

## 2024-08-05 ENCOUNTER — Ambulatory Visit (INDEPENDENT_AMBULATORY_CARE_PROVIDER_SITE_OTHER)

## 2024-08-05 VITALS — BP 130/62 | HR 104 | Temp 97.4°F | Ht 67.5 in | Wt 250.8 lb

## 2024-08-05 DIAGNOSIS — R0609 Other forms of dyspnea: Secondary | ICD-10-CM

## 2024-08-05 DIAGNOSIS — I2510 Atherosclerotic heart disease of native coronary artery without angina pectoris: Secondary | ICD-10-CM | POA: Diagnosis not present

## 2024-08-05 DIAGNOSIS — J984 Other disorders of lung: Secondary | ICD-10-CM

## 2024-08-05 DIAGNOSIS — J449 Chronic obstructive pulmonary disease, unspecified: Secondary | ICD-10-CM

## 2024-08-05 DIAGNOSIS — J439 Emphysema, unspecified: Secondary | ICD-10-CM | POA: Diagnosis not present

## 2024-08-05 DIAGNOSIS — G4733 Obstructive sleep apnea (adult) (pediatric): Secondary | ICD-10-CM | POA: Diagnosis not present

## 2024-08-05 DIAGNOSIS — Z6838 Body mass index (BMI) 38.0-38.9, adult: Secondary | ICD-10-CM

## 2024-08-05 DIAGNOSIS — F17210 Nicotine dependence, cigarettes, uncomplicated: Secondary | ICD-10-CM | POA: Diagnosis not present

## 2024-08-05 LAB — BASIC METABOLIC PANEL WITH GFR
BUN: 22 mg/dL (ref 6–23)
CO2: 26 meq/L (ref 19–32)
Calcium: 9 mg/dL (ref 8.4–10.5)
Chloride: 103 meq/L (ref 96–112)
Creatinine, Ser: 1.67 mg/dL — ABNORMAL HIGH (ref 0.40–1.20)
GFR: 30.74 mL/min — ABNORMAL LOW
Glucose, Bld: 132 mg/dL — ABNORMAL HIGH (ref 70–99)
Potassium: 4.5 meq/L (ref 3.5–5.1)
Sodium: 137 meq/L (ref 135–145)

## 2024-08-05 LAB — CBC WITH DIFFERENTIAL/PLATELET
Basophils Absolute: 0.1 K/uL (ref 0.0–0.1)
Basophils Relative: 0.8 % (ref 0.0–3.0)
Eosinophils Absolute: 0.2 K/uL (ref 0.0–0.7)
Eosinophils Relative: 1.8 % (ref 0.0–5.0)
HCT: 33 % — ABNORMAL LOW (ref 36.0–46.0)
Hemoglobin: 11.5 g/dL — ABNORMAL LOW (ref 12.0–15.0)
Lymphocytes Relative: 12 % (ref 12.0–46.0)
Lymphs Abs: 1.4 K/uL (ref 0.7–4.0)
MCHC: 34.9 g/dL (ref 30.0–36.0)
MCV: 85.5 fl (ref 78.0–100.0)
Monocytes Absolute: 0.5 K/uL (ref 0.1–1.0)
Monocytes Relative: 4.3 % (ref 3.0–12.0)
Neutro Abs: 9.7 K/uL — ABNORMAL HIGH (ref 1.4–7.7)
Neutrophils Relative %: 81.1 % — ABNORMAL HIGH (ref 43.0–77.0)
Platelets: 205 K/uL (ref 150.0–400.0)
RBC: 3.85 Mil/uL — ABNORMAL LOW (ref 3.87–5.11)
RDW: 14.5 % (ref 11.5–15.5)
WBC: 12 K/uL — ABNORMAL HIGH (ref 4.0–10.5)

## 2024-08-05 LAB — BRAIN NATRIURETIC PEPTIDE: Pro B Natriuretic peptide (BNP): 138 pg/mL — ABNORMAL HIGH (ref 1.0–100.0)

## 2024-08-05 MED ORDER — PREDNISONE 20 MG PO TABS: 20.0000 mg | ORAL_TABLET | Freq: Every day | ORAL | 0 refills | Status: AC

## 2024-08-05 MED ORDER — ALBUTEROL SULFATE HFA 108 (90 BASE) MCG/ACT IN AERS: 1.0000 | INHALATION_SPRAY | Freq: Four times a day (QID) | RESPIRATORY_TRACT | 5 refills | Status: AC | PRN

## 2024-08-05 NOTE — Patient Instructions (Addendum)
 Chest xray today.  Continue on Breztri  2 puffs Twice daily, rinse after use.  Albuterol  inhaler or neb As needed   Labs today.  Prednisone  20mg  daily for 5 days, take with food.  Continue on CPAP At bedtime.  Cut back on smoking , try to quit (1800QUITNOW) Follow up with Dr. Olena or Miraj Truss NP in 6 weeks with PFT and As needed   Please contact office for sooner follow up if symptoms do not improve or worsen or seek emergency care

## 2024-08-05 NOTE — Progress Notes (Signed)
 "  @Patient  ID: [name] [name], female    DOB: 12-28-1955, 69 y.o.   MRN: [id number]  Chief Complaint  Patient presents with   Acute Visit    SOB wheezing    Referring provider: Millicent Sharper, MD  HPI: 69 year old female smoker followed for COPD, chronic restrictive lung disease, lung nodules, obstructive sleep apnea, history of pneumonia Medical history significant for coronary artery disease, hyperlipidemia, hypertension, diabetes Participates in Lung cancer CT chest screening  Widow-husband was our patient -[name]  Former Dr. Neysa  patient     TEST/EVENTS : Reviewed 08/05/2024  NPSG 01/04/03 [place] Severe OSA, RDI.( AHI )32.3/hr. [place] 11/24, weight 270 lbs. PFT 08/26/2011-restriction. FVC 68%, FEV1 62%, FEV1/FVC 0.93. Office Spirometry 02/24/2014-mild obstruction and restriction of exhaled volume. FVC 2.64/72%, FEV1 1.80/63%, FEV1/FVC 0.68, FEF 25-75% 0.92/35% Office Spirometry 10/19/2015-mild obstruction with mild restriction of exhaled volume. FVC 2.62/73%, FEV1 1.92/69%, ratio 0.73, FEF 25-75 percent 1.35/54% CT chest 01/15/17, 04/11/17 Anterior right upper lobe nodule, multiple bilateral small upper lobe nodules Office Spirometry 07/13/18-mild obstruction, mild restriction of FVC.  FVC 2.4/63%, FEV1 1.7/60%, ratio 1.73, FEF 25-75% 1.3/52% Walk test O2 qualifying 01/22/2022-lowest saturation 98% on room air with maximum heart rate 100.  Does not qualify. PFT 04/26/22- mod obst, no resp. Nl Volume and DLCO CT chest 04/06/24 stable lung nodules    Discussed the use of AI scribe software for clinical note transcription with the patient, who gave verbal consent to proceed.  History of Present Illness [name] [name] is a 69 year old female with COPD and a recent admission for NSTEMI who presents with increased use of her rescue inhaler and worsening shortness of breath.  Patient is followed for COPD and restrictive lung disease.  Former patient of Dr. Neysa.  She is here for an  acute office visit.  She has been experiencing increased use of her rescue inhaler, stating she has been using it 'so much more than usual.over last several months' She experiences occasional wheezing and shortness of breath, which has worsened since her recent hospitalization for a heart attack last month.  No coughing up discolored sputum, fever, or hemoptysis. She describes significant shortness of breath with minimal exertion, such as walking a few steps to the bathroom or from her room to the couch, which has been progressively worsening over the past few months. She is currently on Breztri , taking two puffs in the morning and evening, and uses a nebulizer with albuterol  as needed.  Walk test in the office shows no significant desaturations on room air.  Says over the last few months she feels her breathing has not been doing as well.  She has increased shortness of breath with activities and decreased activity tolerance  She has a history of chronic nausea since March of the previous year, with no identified cause despite extensive workup including gastric emptying studies and CT scans. She reports altered taste and smell, and uses Phenergan for nausea, which provides some relief.  Is following with primary care and gastroenterology.  She has diabetes, managed with a continuous glucose monitor [facility]), and reports her blood sugar is well-controlled. She also has chronic kidney disease, stage 3B, but has not yet seen a nephrologist.  She smokes about a pack and a half of cigarettes daily and has tried various cessation methods in the past, including Nicorette and Wellbutrin, with limited success. She is considering using lozenges.  She uses a CPAP machine for sleep apnea and reports compliance with its use.  Feels that she benefits from CPAP download shows excellent compliance with daily average usage at 10.5 hours she is on auto CPAP 10 to 20 cm H2O.  AHI 2.0   Admitted last month with acute  coronary syndrome with non-STEMI.  Patient presented with chest pain, elevated troponins.  She underwent left heart cath on July 09, 2024 found to have proximal to mid RCA 95% stenosed underwent PCI with DES to RCA.  Started on DAPT (aspirin 81 mg and Brilinta twice daily) Echo showed EF of 60 to 65%, mild mitral stenosis  Allergies[1]  Immunization History  Administered Date(s) Administered   Fluad Quad(high Dose 65+) 05/07/2019, 05/02/2022   INFLUENZA, HIGH DOSE SEASONAL PF 06/26/2020, 04/26/2021   Influenza Inj Mdck Quad Pf 05/01/2017, 05/28/2018   Influenza Nasal 04/28/2010   Influenza Split 03/29/2012, 04/28/2013, 05/10/2015   Influenza,inj,Quad PF,6+ Mos 04/12/2015, 05/01/2016   Influenza-Unspecified 06/30/2004, 05/19/2014, 04/12/2015, 05/01/2016, 05/16/2017, 05/28/2018   PFIZER Comirnaty(Gray Top)Covid-19 Tri-Sucrose Vaccine 01/01/2021, 05/08/2022   PFIZER(Purple Top)SARS-COV-2 Vaccination 09/06/2019, 10/01/2019, 07/11/2020   Pfizer Covid-19 Vaccine Bivalent Booster 40yrs & up 05/19/2021   Pfizer(Comirnaty)Fall Seasonal Vaccine 12 years and older 04/01/2023   Pneumococcal Conjugate-13 05/24/2019   Pneumococcal Polysaccharide-23 06/21/2002, 12/26/2020   Td 09/20/2002   Tdap 11/01/2010   Zoster Recombinant(Shingrix) 01/01/2021, 05/15/2022    Past Medical History:  Diagnosis Date   Anxiety    COPD (chronic obstructive pulmonary disease) (HCC)    Depression    Hyperlipidemia    Hypertension    Restrictive lung disease    Sleep apnea     Tobacco History: Tobacco Use History[2] Ready to quit: Not Answered Counseling given: Not Answered Tobacco comments: Smoking 1.5 pack per day PAP 07/25/2022   Outpatient Medications Prior to Visit  Medication Sig Dispense Refill   albuterol  (PROVENTIL ) (2.5 MG/3ML) 0.083% nebulizer solution USE 1 VIAL IN NEBULIZER EVERY 6 HOURS AND AS NEEDED. Generic: VENTOLIN  360 mL 4   albuterol  (VENTOLIN  HFA) 108 (90 Base) MCG/ACT inhaler Inhale  2 puffs into the lungs every 6 (six) hours as needed for wheezing or shortness of breath. wheezing 18 g 12   amLODipine  (NORVASC ) 5 MG tablet Take 5 mg by mouth daily.     aspirin 81 MG chewable tablet Chew 81 mg by mouth daily.     bismuth subsalicylate (PEPTO BISMOL) 262 MG/15ML suspension Take 30 mLs by mouth every 6 (six) hours as needed.     BREZTRI  AEROSPHERE 160-9-4.8 MCG/ACT AERO inhaler INHALE 2 PUFFS THEN RINSE MOUTH, TWICE DAILY 10.7 each 12   Cholecalciferol (VITAMIN D) 2000 units CAPS Take 1 capsule by mouth daily. (Patient taking differently: Take 1 capsule by mouth in the morning and at bedtime.)     DULoxetine  (CYMBALTA ) 60 MG capsule Take 60 mg by mouth every other day.  (Patient taking differently: Take 60 mg by mouth daily.)     insulin  degludec (TRESIBA) 100 UNIT/ML FlexTouch Pen Inject into the skin daily.     Magnesium  Oxide 500 MG TABS Take by mouth.     metFORMIN (GLUCOPHAGE) 1000 MG tablet Take 1,000 mg by mouth 2 (two) times daily with a meal.     oxybutynin  (DITROPAN -XL) 10 MG 24 hr tablet Take 10 mg by mouth 2 (two) times daily.     pantoprazole  (PROTONIX ) 40 MG tablet Take 40 mg by mouth daily.     rosuvastatin (CRESTOR) 40 MG tablet Take 1 tablet by mouth daily.     ticagrelor (BRILINTA) 90 MG TABS tablet Take 90  mg by mouth 2 (two) times daily.     traZODone  (DESYREL ) 150 MG tablet Take by mouth at bedtime. Take 1/3 to 1 tablet at bedtime as needed.     glucose blood (ONETOUCH ULTRA) test strip CHECK SUGARS ONCE DAILY. DX: E11.40 (Patient not taking: Reported on 08/05/2024)     hydrochlorothiazide (HYDRODIURIL) 25 MG tablet Take 25 mg by mouth daily. (Patient not taking: Reported on 08/05/2024)     pregabalin (LYRICA) 100 MG capsule Take 100 mg by mouth 2 (two) times daily. 100mg  tablet in the morning. 200 mg in the evening (Patient not taking: Reported on 08/05/2024)     promethazine (PHENERGAN) 25 MG tablet Take 25 mg by mouth every 8 (eight) hours as needed.      revefenacin  (YUPELRI ) 175 MCG/3ML nebulizer solution Take 3 mLs (175 mcg total) by nebulization daily. (Patient not taking: Reported on 08/05/2024) 90 mL 0   spironolactone (ALDACTONE) 25 MG tablet Take 25 mg by mouth at bedtime. (Patient not taking: Reported on 08/05/2024)     No facility-administered medications prior to visit.     Review of Systems:   Constitutional:   No  weight loss, night sweats,  Fevers, chills, +fatigue, or  lassitude. +tremor   HEENT:   No headaches,  Difficulty swallowing,  Tooth/dental problems, or  Sore throat,                No sneezing, itching, ear ache, nasal congestion, post nasal drip,   CV:  No chest pain,  Orthopnea, PND, swelling in lower extremities, anasarca, dizziness, palpitations, syncope.   GI  No heartburn, indigestion, abdominal pain, nausea, vomiting, diarrhea, change in bowel habits, loss of appetite, bloody stools.   Resp:   No chest wall deformity  Skin: no rash or lesions.  GU: no dysuria, change in color of urine, no urgency or frequency.  No flank pain, no hematuria   MS:  No joint pain or swelling.  No decreased range of motion.  No back pain.    Physical Exam  BP (!) 142/66   Pulse 76   Temp (!) 97.4 F (36.3 C)   Ht 5' 7.5 (1.715 m) Comment: Per pt  Wt 250 lb 12.8 oz (113.8 kg)   SpO2 98% Comment: RA  BMI 38.70 kg/m   GEN: A/Ox3; pleasant , NAD, well nourished    HEENT:  [place]/AT, NOSE-clear, THROAT-clear, no lesions, no postnasal drip or exudate noted.   NECK:  Supple w/ fair ROM; no JVD; normal carotid impulses w/o bruits; no thyromegaly or nodules palpated; no lymphadenopathy.    RESP  Clear  P & A; w/o, wheezes/ rales/ or rhonchi. no accessory muscle use, no dullness to percussion  CARD:  RRR, no m/r/g, no peripheral edema, pulses intact, no cyanosis or clubbing.  GI:   Soft & nt; nml bowel sounds; no organomegaly or masses detected.   Musco: Warm bil, no deformities or joint swelling noted.   Neuro: alert,  bilateral hand tremor  Skin: Warm, no lesions or rashes    Lab Results:Reviewed 08/05/2024   CBC     BNP   ProBNP No results found for: PROBNP  Imaging: No results found.  Administration History     None          Latest Ref Rng & Units 04/26/2022    4:00 PM  PFT Results  FVC-Pre L 2.41   FVC-Predicted Pre % 70   FVC-Post L 2.44   FVC-Predicted Post % 70  Pre FEV1/FVC % % 71   Post FEV1/FCV % % 71   FEV1-Pre L 1.71   FEV1-Predicted Pre % 65   FEV1-Post L 1.74   DLCO uncorrected ml/min/mmHg 20.96   DLCO UNC% % 96   DLCO corrected ml/min/mmHg 20.96   DLCO COR %Predicted % 96   DLVA Predicted % 117   TLC L 5.07   TLC % Predicted % 92   RV % Predicted % 108     No results found for: NITRICOXIDE      No data to display              Assessment & Plan:   Assessment and Plan Assessment & Plan 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  Check chest x-ray today.  Trial of brief steroid burst.  Begin prednisone  20 mg daily for 5 days.  Check PFTs on return. She has increased use of her rescue inhaler and occasional wheezing since her recent myocardial infarction, but no fever, hemoptysis, chest pain or calf pain or discolored sputum. No VTE history. A previous chest CT showed mild emphysema and stable nodules. Her oxygen saturation was 98% upon arrival.  Walk test today in office without desaturations on room air.  The differential includes a COPD exacerbation, with restrictive lung disease and emphysema contributing to decreased lung reserve and stiffness. A chest x-ray is ordered to assess her lung condition. She is prescribed a medium dose of prednisone  to manage potential COPD exacerbation and advised to use a nebulizer with albuterol  once daily. She should continue Breztri  as prescribed. Blood work is ordered to rule out other causes of her symptoms,  and PFT on return.  Albuterol  inhaler  and nebulizer as needed  Nicotine  dependence  -ongoing with heavy smoking history.  She currently smokes 1.5 packs per day. She previously used Nicorette  but is allergic to nicotine  patches and not a candidate for Chantix due to medication interactions. A gradual reduction strategy and behavioral modifications are discussed to aid cessation. She is provided with the 1-800 quit line number for smoking cessation resources and advised to reduce smoking by one cigarette every 4-5 days. Behavioral modifications such as using stress balls and straws are recommended to reduce smoking, and she is encouraged to stop smoking indoors to increase the effort required to smoke.  Obstructive sleep apnea  -excellent control compliance on CPAP.  Continue on CPAP at bedtime.  CPAP care discussed in detail  Morbid obesity with BMI 38.  Healthy weight management discussed  Coronary disease/non-STEMI with recent hospitalization clinically appears to be stable.  Continue on current regimen and follow-up with cardiology.  Plan  Patient Instructions  Chest xray today.  Continue on Breztri  2 puffs Twice daily, rinse after use.  Albuterol  inhaler or neb As needed   Labs today.  Prednisone  20mg  daily for 5 days, take with food.  Continue on CPAP At bedtime.  Cut back on smoking , try to quit (1800QUITNOW) Follow up with Dr. Olena or Sydelle Sherfield NP in 6 weeks with PFT and As needed   Please contact office for sooner follow up if symptoms do not improve or worsen or seek emergency care             Syncere Eble, NP 08/05/2024      [1]  Allergies Allergen Reactions   Clindamycin/Lincomycin Itching    Pt whole body broke out in rash and severe itching   Nicotine  Other (See Comments)  PATCH- gives blisters and rash   Ppd [Tuberculin Purified Protein Derivative]     Pt states she has hives and itching with this test   Sulfa Antibiotics Other (See Comments)    Dizzines, nausea   Sulfasalazine Other  (See Comments)    Dizzines, nausea   Penicillins Rash  [2]  Social History Tobacco Use  Smoking Status Every Day   Current packs/day: 1.50   Average packs/day: 1.5 packs/day for 59.0 years (88.5 ttl pk-yrs)   Types: Cigarettes   Start date: 1967   Passive exposure: Current  Smokeless Tobacco Never  Tobacco Comments   Smoking 1.5 pack per day PAP 07/25/2022   "

## 2024-08-06 ENCOUNTER — Ambulatory Visit: Payer: Self-pay | Admitting: Adult Health

## 2024-08-26 ENCOUNTER — Ambulatory Visit: Payer: Medicare Other | Admitting: Internal Medicine
